# Patient Record
Sex: Male | Born: 1980 | Race: White | Hispanic: No | Marital: Married | Smoking: Never smoker
Health system: Southern US, Community
[De-identification: ages and names within clinical notes are randomized; demographics above are authoritative.]

## PROBLEM LIST (undated history)

## (undated) DIAGNOSIS — K5792 Diverticulitis of intestine, part unspecified, without perforation or abscess without bleeding: Secondary | ICD-10-CM

## (undated) DIAGNOSIS — M109 Gout, unspecified: Secondary | ICD-10-CM

## (undated) DIAGNOSIS — E7800 Pure hypercholesterolemia, unspecified: Secondary | ICD-10-CM

## (undated) HISTORY — PX: SMALL INTESTINE SURGERY: SHX150

---

## 2018-08-09 ENCOUNTER — Observation Stay
Admission: EM | Admit: 2018-08-09 | Discharge: 2018-08-10 | Disposition: A | Discharge status: Home / Self Care | Payer: 59 | Attending: General Surgery | Admitting: General Surgery

## 2018-08-09 ENCOUNTER — Encounter
Admission: EM | Disposition: A | Discharge status: Home / Self Care | Payer: Self-pay | Source: Home / Self Care | Attending: Emergency Medicine

## 2018-08-09 ENCOUNTER — Observation Stay: Payer: 59 | Admitting: Anesthesiology

## 2018-08-09 ENCOUNTER — Other Ambulatory Visit: Payer: Self-pay

## 2018-08-09 ENCOUNTER — Encounter: Payer: Self-pay | Admitting: Emergency Medicine

## 2018-08-09 ENCOUNTER — Emergency Department: Payer: 59

## 2018-08-09 DIAGNOSIS — K353 Acute appendicitis with localized peritonitis, without perforation or gangrene: Secondary | ICD-10-CM | POA: Diagnosis present

## 2018-08-09 DIAGNOSIS — K3533 Acute appendicitis with perforation and localized peritonitis, with abscess: Principal | ICD-10-CM | POA: Insufficient documentation

## 2018-08-09 DIAGNOSIS — K5730 Diverticulosis of large intestine without perforation or abscess without bleeding: Secondary | ICD-10-CM | POA: Insufficient documentation

## 2018-08-09 DIAGNOSIS — Z23 Encounter for immunization: Secondary | ICD-10-CM | POA: Insufficient documentation

## 2018-08-09 DIAGNOSIS — K3580 Unspecified acute appendicitis: Secondary | ICD-10-CM | POA: Diagnosis present

## 2018-08-09 HISTORY — PX: LAPAROSCOPIC APPENDECTOMY: SHX408

## 2018-08-09 LAB — TYPE AND SCREEN
ABO/RH(D): O POS
Antibody Screen: NEGATIVE

## 2018-08-09 LAB — URINALYSIS, COMPLETE (UACMP) WITH MICROSCOPIC
BILIRUBIN URINE: NEGATIVE
Bacteria, UA: NONE SEEN
Glucose, UA: NEGATIVE mg/dL
HGB URINE DIPSTICK: NEGATIVE
KETONES UR: NEGATIVE mg/dL
Leukocytes, UA: NEGATIVE
Nitrite: NEGATIVE
PH: 6 (ref 5.0–8.0)
Protein, ur: NEGATIVE mg/dL
SQUAMOUS EPITHELIAL / LPF: NONE SEEN (ref 0–5)
Specific Gravity, Urine: 1.042 — ABNORMAL HIGH (ref 1.005–1.030)

## 2018-08-09 LAB — CBC
HCT: 43.3 % (ref 39.0–52.0)
Hemoglobin: 15.6 g/dL (ref 13.0–17.0)
MCH: 31.1 pg (ref 26.0–34.0)
MCHC: 36 g/dL (ref 30.0–36.0)
MCV: 86.3 fL (ref 80.0–100.0)
Platelets: 288 K/uL (ref 150–400)
RBC: 5.02 MIL/uL (ref 4.22–5.81)
RDW: 12.1 % (ref 11.5–15.5)
WBC: 10.6 K/uL — ABNORMAL HIGH (ref 4.0–10.5)
nRBC: 0 % (ref 0.0–0.2)

## 2018-08-09 LAB — COMPREHENSIVE METABOLIC PANEL WITH GFR
ALT: 53 U/L — ABNORMAL HIGH (ref 0–44)
AST: 27 U/L (ref 15–41)
Albumin: 4.6 g/dL (ref 3.5–5.0)
Alkaline Phosphatase: 85 U/L (ref 38–126)
Anion gap: 9 (ref 5–15)
BUN: 12 mg/dL (ref 6–20)
CO2: 26 mmol/L (ref 22–32)
Calcium: 9.3 mg/dL (ref 8.9–10.3)
Chloride: 105 mmol/L (ref 98–111)
Creatinine, Ser: 1.01 mg/dL (ref 0.61–1.24)
GFR calc Af Amer: >60 mL/min
GFR calc non Af Amer: >60 mL/min
Glucose, Bld: 131 mg/dL — ABNORMAL HIGH (ref 70–99)
Potassium: 3.6 mmol/L (ref 3.5–5.1)
Sodium: 140 mmol/L (ref 135–145)
Total Bilirubin: 0.8 mg/dL (ref 0.3–1.2)
Total Protein: 7.6 g/dL (ref 6.5–8.1)

## 2018-08-09 LAB — LIPASE, BLOOD: Lipase: 97 U/L — ABNORMAL HIGH (ref 11–51)

## 2018-08-09 LAB — SURGICAL PCR SCREEN
MRSA, PCR: NEGATIVE
Staphylococcus aureus: POSITIVE — AB

## 2018-08-09 IMAGING — CR DG ABDOMEN ACUTE W/ 1V CHEST
4 series · 4 of 4 positions shown · non-contrast
Comparison: None.

CLINICAL DATA: Abdominal pain

EXAM:
DG ABDOMEN ACUTE W/ 1V CHEST

[chest pa]
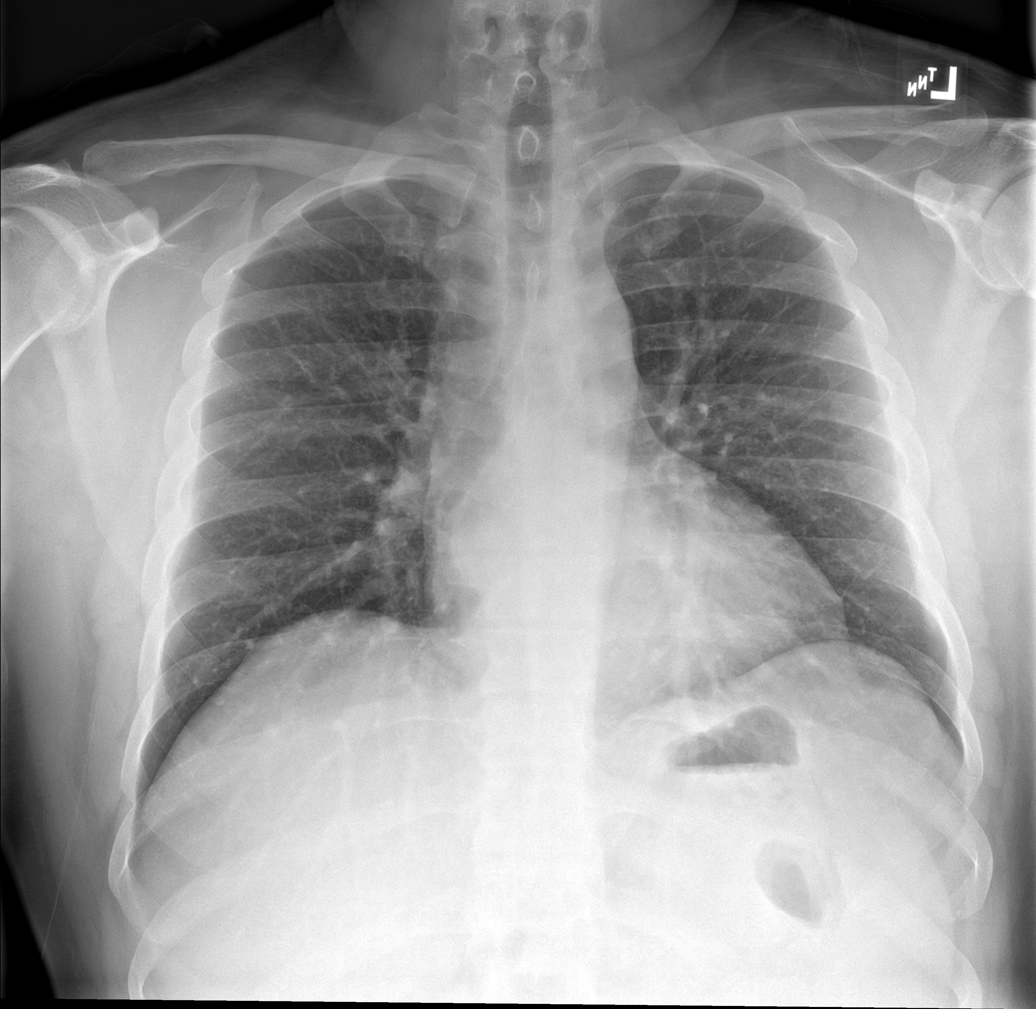

[abdomen erect]
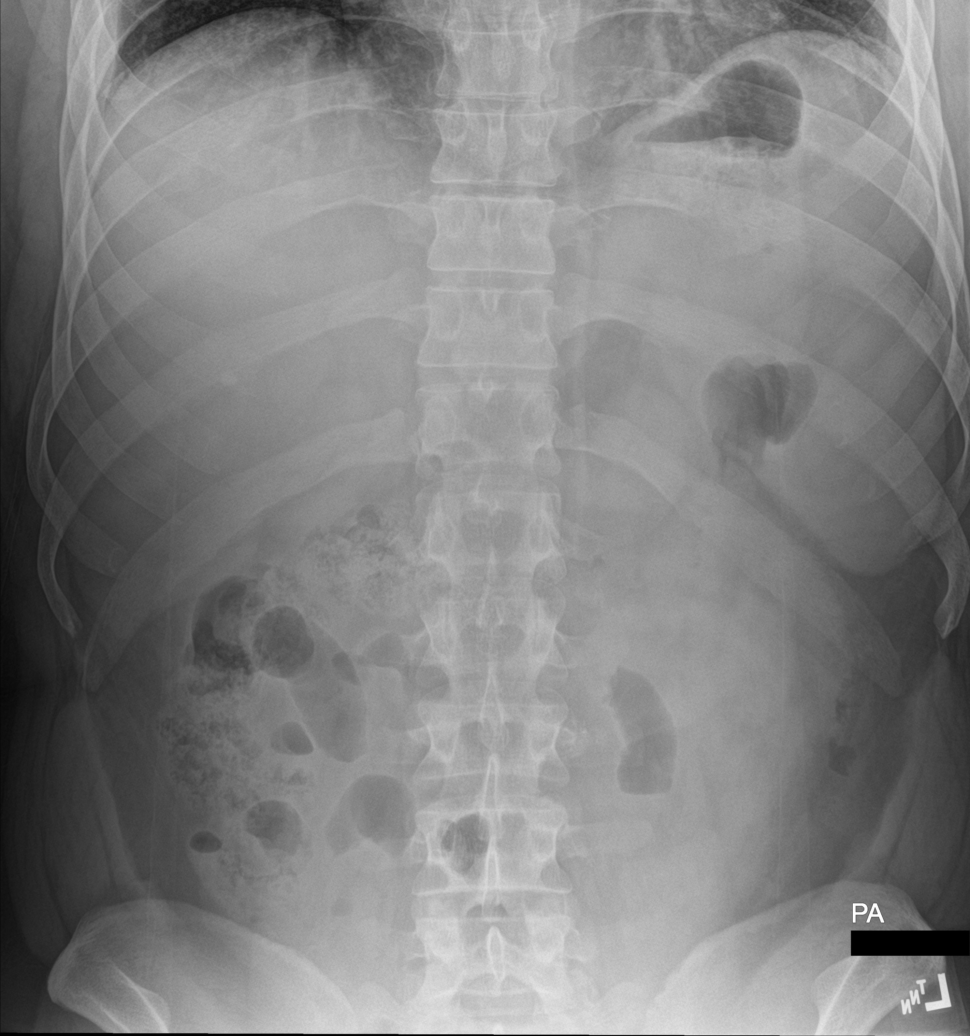

[abdomen supine (1 of 2)]
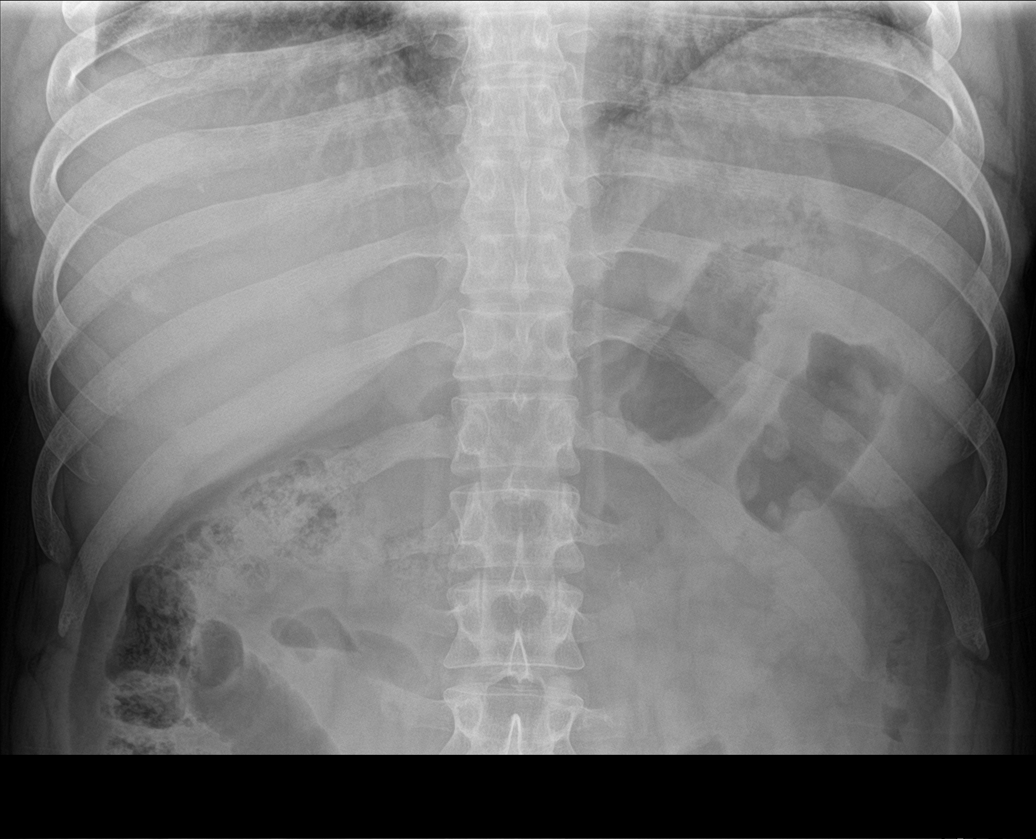

[abdomen supine (2 of 2)]
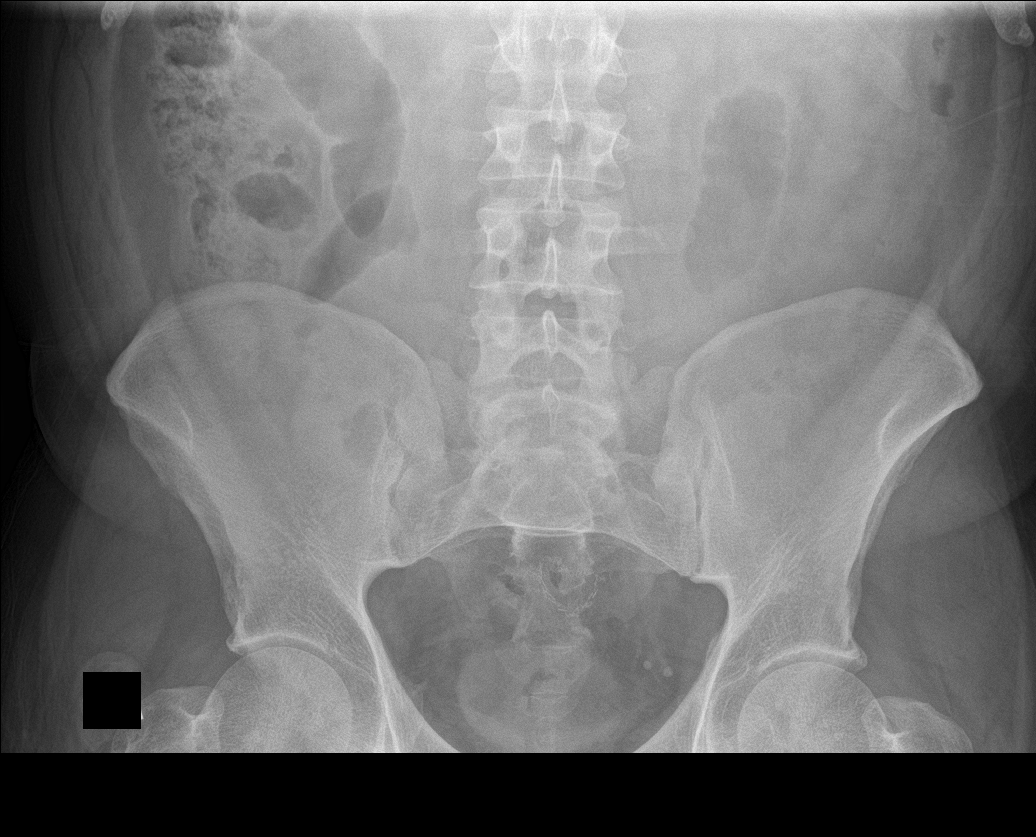

[4 of 4 positions shown; findings below may reference images not displayed]

FINDINGS: PA chest: Lungs are clear. Heart size and pulmonary vascularity are
normal. No adenopathy.

Supine and upright abdomen: There is moderate stool in the colon.
There is no appreciable bowel dilatation or air-fluid level to
suggest bowel obstruction. No free air. There are phleboliths in the
pelvis.
IMPRESSION: No evident bowel obstruction or free air. Moderate stool in colon.
Lungs clear.

## 2018-08-09 IMAGING — CT CT ABD-PELV W/ CM
2 of 4 series · 16 of 46 positions shown, 18 images · IV contrast (APPLIED)
Comparison: None.

CLINICAL DATA: Left-sided abdominal pain since this morning.
History of diverticulitis and partial colon resection.

EXAM:
CT ABDOMEN AND PELVIS WITH CONTRAST
TECHNIQUE: Multidetector CT imaging of the abdomen and pelvis was performed
using the standard protocol following bolus administration of
intravenous contrast.
CONTRAST:  100mL OMNIPAQUE IOHEXOL 300 MG/ML  SOLN

[Series 2: routine abd/pel with · axial · 0.85mm/px · z∈[-608,-128]mm · 13 of 106 slices shown, 15 images]
[im 5/106  soft-tissue]
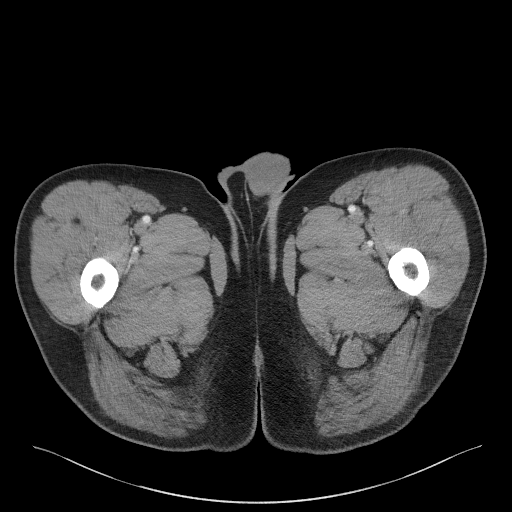
[im 5/106  bone]
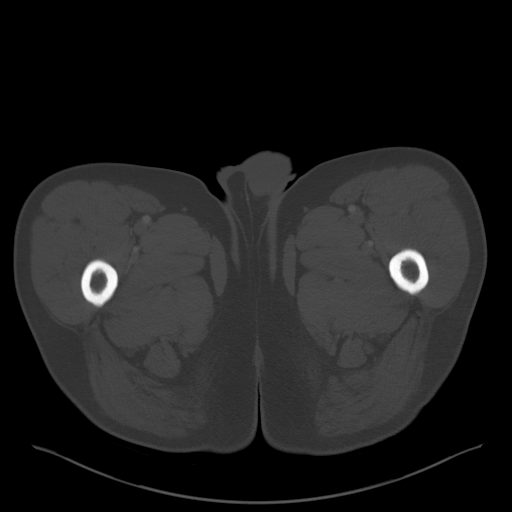
[im 14/106  soft-tissue]
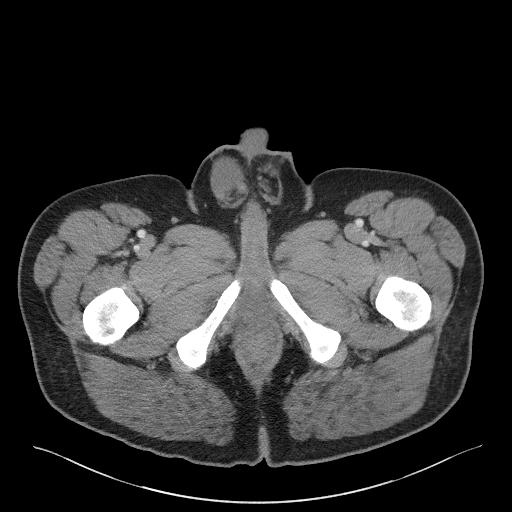
[im 23/106  soft-tissue]
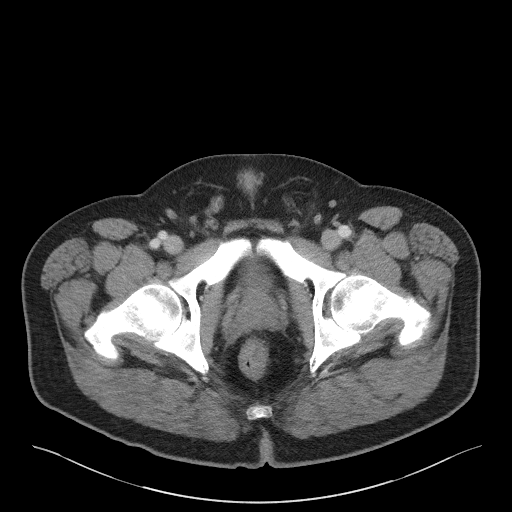
[im 28/106  soft-tissue]
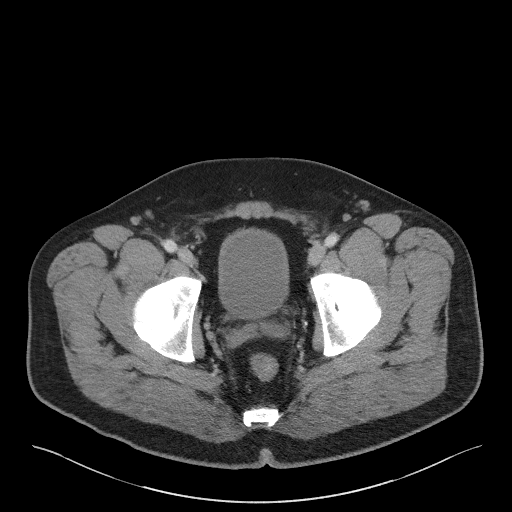
[im 37/106  soft-tissue]
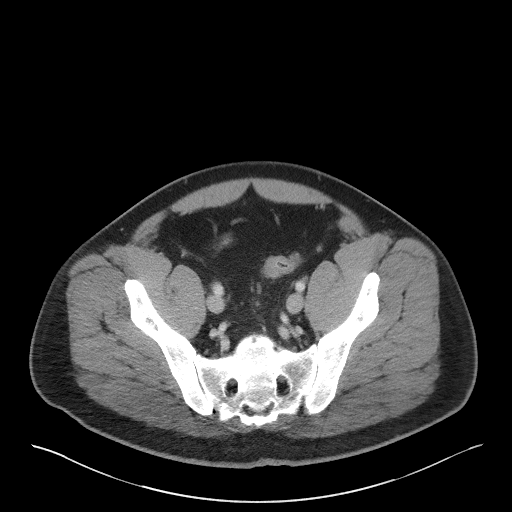
[im 46/106  soft-tissue]
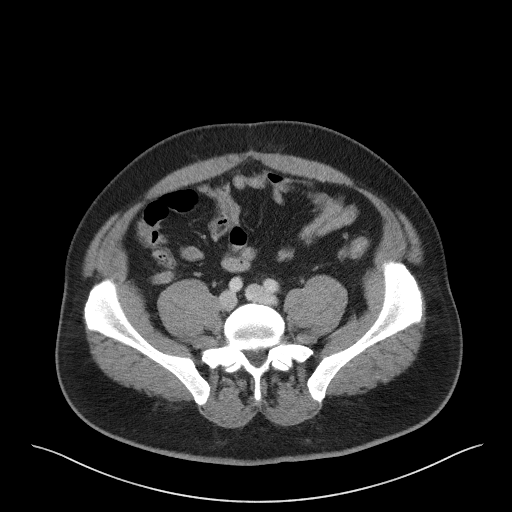
[im 55/106  soft-tissue]
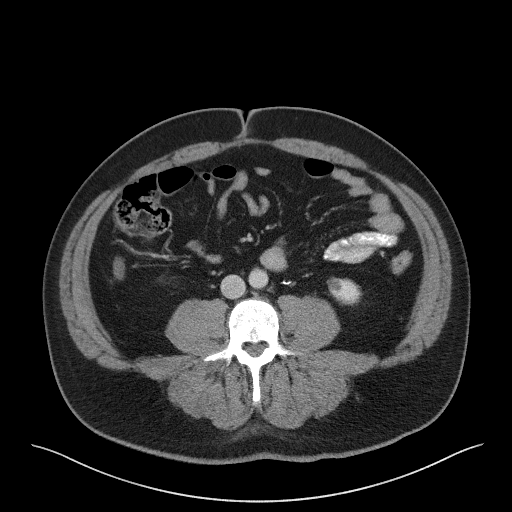
[im 60/106  soft-tissue]
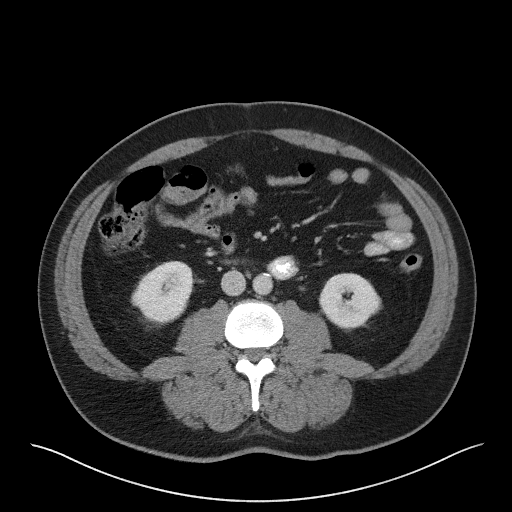
[im 69/106  soft-tissue]
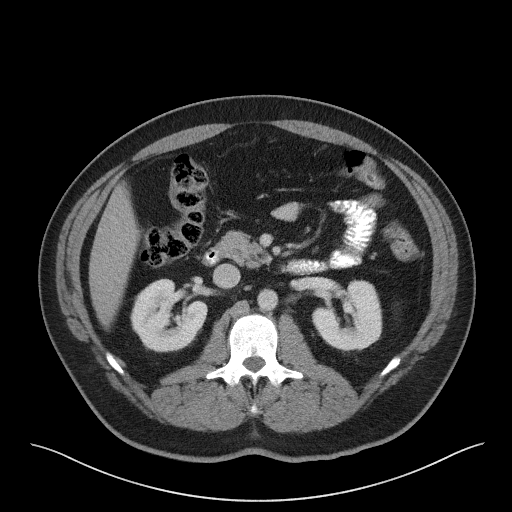
[im 69/106  bone]
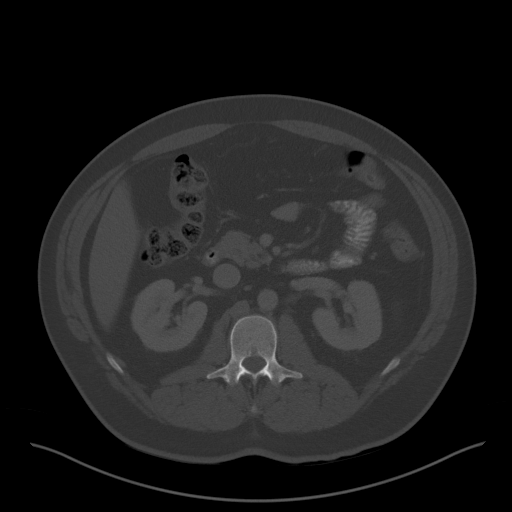
[im 78/106  soft-tissue]
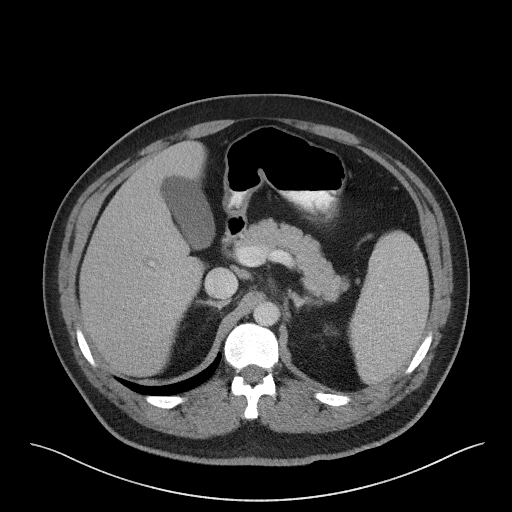
[im 83/106  soft-tissue]
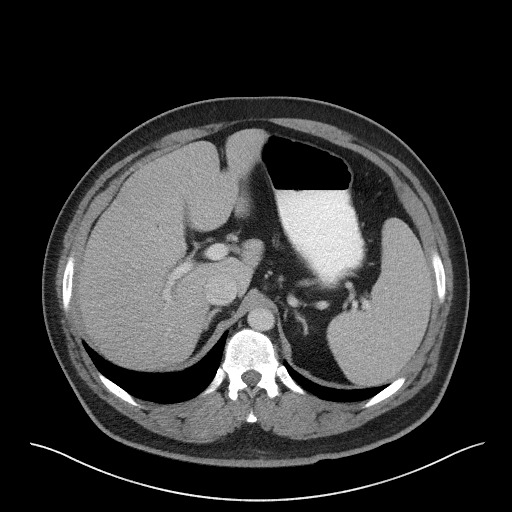
[im 92/106  soft-tissue]
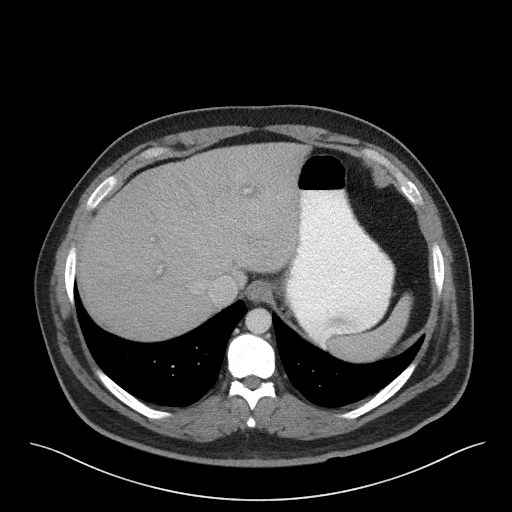
[im 101/106  soft-tissue]
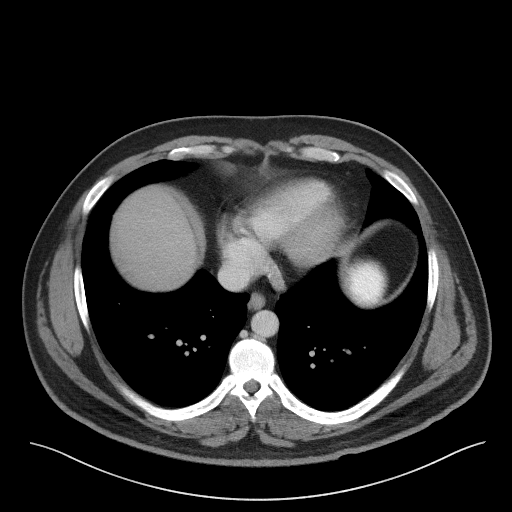

[Series 5: coronal st · coronal · 0.78mm/px · 3 of 108 slices shown]
[im 36/108  soft-tissue]
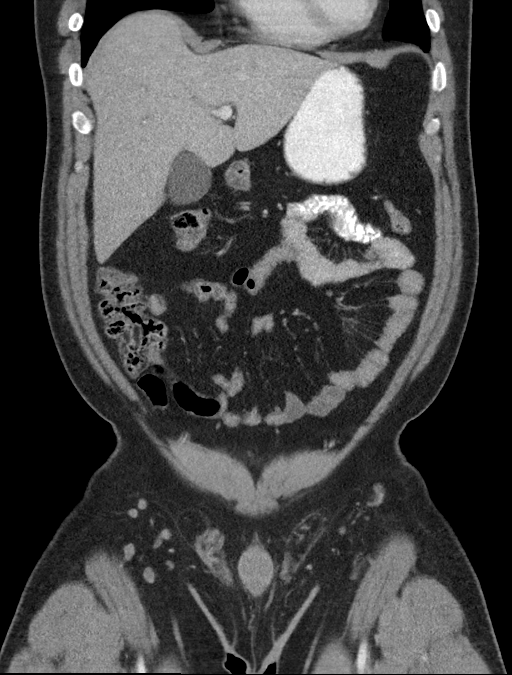
[im 48/108  soft-tissue]
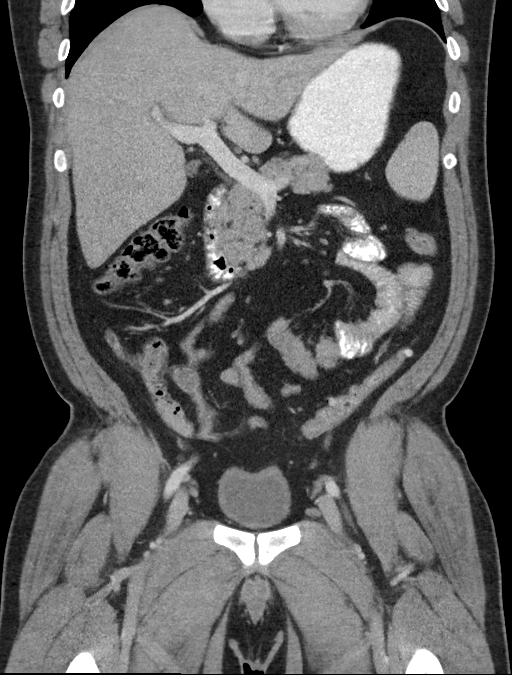
[im 60/108  soft-tissue]
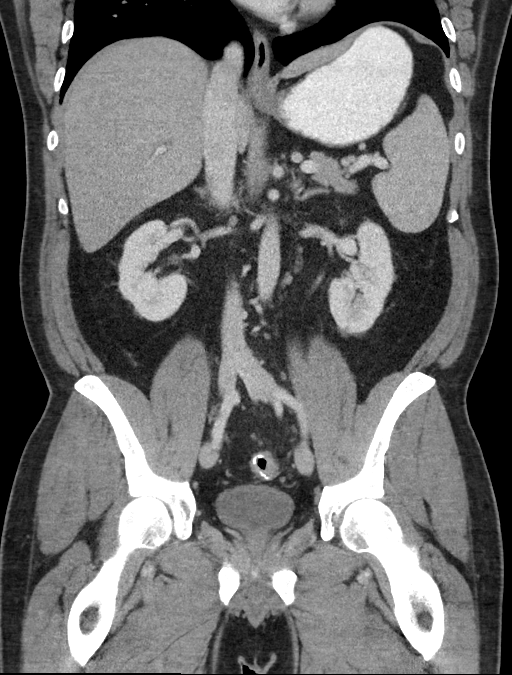

[16 of 46 positions shown; findings below may reference images not displayed]

FINDINGS: Lower chest: No acute abnormality.

Hepatobiliary: No focal liver abnormality is seen. Mild periportal
edema. No gallstones, gallbladder wall thickening, or biliary
dilatation.

Pancreas: Unremarkable. No pancreatic ductal dilatation or
surrounding inflammatory changes.

Spleen: Normal in size without focal abnormality.

Adrenals/Urinary Tract: Adrenal glands are unremarkable. Kidneys are
normal, without renal calculi, focal lesion, or hydronephrosis.
cm simple cyst arising from the lower pole of the left kidney.
Bladder is unremarkable.

Stomach/Bowel: Dilated appendix with mild periappendiceal fat
stranding, consistent with acute appendicitis.

Appendix: Location: Retrocecal

Diameter: 12 mm

Appendicolith: Present in the proximal appendix.

Mucosal hyper-enhancement: Present

Extraluminal gas: None.

Periappendiceal collection: None.

The stomach and small bowel are unremarkable. No obstruction. Prior
partial sigmoid colon resection. Mild sigmoid diverticulosis.

Vascular/Lymphatic: No significant vascular findings are present. No
enlarged abdominal or pelvic lymph nodes.

Reproductive: Prostate is unremarkable.

Other: No abdominal wall hernia or abnormality. No abdominopelvic
ascites. No pneumoperitoneum.

Musculoskeletal: No acute or significant osseous findings.
IMPRESSION: 1. Acute appendicitis.
2. Prior partial sigmoid colon resection. Mild sigmoid
diverticulosis without evidence of acute diverticulitis.

## 2018-08-09 SURGERY — APPENDECTOMY, LAPAROSCOPIC
Anesthesia: General

## 2018-08-09 MED ORDER — BUPIVACAINE-EPINEPHRINE 0.5% -1:200000 IJ SOLN
INTRAMUSCULAR | Status: DC | PRN
  Administered 2018-08-09: 10 mL

## 2018-08-09 MED ORDER — ACETAMINOPHEN 325 MG PO TABS: 650.0000 mg | ORAL_TABLET | Freq: Four times a day (QID) | ORAL | Status: DC | PRN

## 2018-08-09 MED ORDER — SUGAMMADEX SODIUM 200 MG/2ML IV SOLN
INTRAVENOUS | Status: DC | PRN
  Administered 2018-08-09: 200 mg via INTRAVENOUS

## 2018-08-09 MED ORDER — ENOXAPARIN SODIUM 40 MG/0.4ML ~~LOC~~ SOLN: 40.0000 mg | SUBCUTANEOUS | Status: DC

## 2018-08-09 MED ORDER — MORPHINE SULFATE (PF) 4 MG/ML IV SOLN
4.0000 mg | Freq: Once | INTRAVENOUS | Status: AC
  Administered 2018-08-09: 4 mg via INTRAVENOUS
  Filled 2018-08-09: qty 1

## 2018-08-09 MED ORDER — LIDOCAINE HCL (CARDIAC) PF 100 MG/5ML IV SOSY
PREFILLED_SYRINGE | INTRAVENOUS | Status: DC | PRN
  Administered 2018-08-09: 100 mg via INTRAVENOUS

## 2018-08-09 MED ORDER — CHLORHEXIDINE GLUCONATE CLOTH 2 % EX PADS: 6.0000 | MEDICATED_PAD | Freq: Every day | CUTANEOUS | Status: DC

## 2018-08-09 MED ORDER — IOHEXOL 300 MG/ML  SOLN
30.0000 mL | Freq: Once | INTRAMUSCULAR | Status: AC | PRN
  Administered 2018-08-09: 30 mL via ORAL

## 2018-08-09 MED ORDER — MIDAZOLAM HCL 2 MG/2ML IJ SOLN
INTRAMUSCULAR | Status: AC
  Filled 2018-08-09: qty 2

## 2018-08-09 MED ORDER — ACETAMINOPHEN 10 MG/ML IV SOLN
INTRAVENOUS | Status: AC
  Filled 2018-08-09: qty 100

## 2018-08-09 MED ORDER — SUCCINYLCHOLINE CHLORIDE 20 MG/ML IJ SOLN
INTRAMUSCULAR | Status: DC | PRN
  Administered 2018-08-09: 100 mg via INTRAVENOUS

## 2018-08-09 MED ORDER — MUPIROCIN 2 % EX OINT
1.0000 "application " | TOPICAL_OINTMENT | Freq: Two times a day (BID) | CUTANEOUS | Status: DC
  Administered 2018-08-10: 1 via NASAL
  Filled 2018-08-09: qty 22

## 2018-08-09 MED ORDER — IOHEXOL 300 MG/ML  SOLN
100.0000 mL | Freq: Once | INTRAMUSCULAR | Status: AC | PRN
  Administered 2018-08-09: 100 mL via INTRAVENOUS

## 2018-08-09 MED ORDER — ROCURONIUM BROMIDE 100 MG/10ML IV SOLN
INTRAVENOUS | Status: DC | PRN
  Administered 2018-08-09 (×2): 30 mg via INTRAVENOUS
  Administered 2018-08-09: 10 mg via INTRAVENOUS

## 2018-08-09 MED ORDER — CIPROFLOXACIN IN D5W 400 MG/200ML IV SOLN
400.0000 mg | Freq: Two times a day (BID) | INTRAVENOUS | Status: DC
  Administered 2018-08-09 – 2018-08-10 (×2): 400 mg via INTRAVENOUS
  Filled 2018-08-09 (×4): qty 200

## 2018-08-09 MED ORDER — INFLUENZA VAC SPLIT QUAD 0.5 ML IM SUSY
0.5000 mL | PREFILLED_SYRINGE | INTRAMUSCULAR | Status: AC
  Administered 2018-08-10: 0.5 mL via INTRAMUSCULAR
  Filled 2018-08-09: qty 0.5

## 2018-08-09 MED ORDER — FENTANYL CITRATE (PF) 100 MCG/2ML IJ SOLN
INTRAMUSCULAR | Status: DC | PRN
  Administered 2018-08-09 (×2): 50 ug via INTRAVENOUS

## 2018-08-09 MED ORDER — MUPIROCIN 2 % EX OINT
1.0000 "application " | TOPICAL_OINTMENT | Freq: Two times a day (BID) | CUTANEOUS | Status: DC
  Filled 2018-08-09: qty 22

## 2018-08-09 MED ORDER — ONDANSETRON HCL 4 MG/2ML IJ SOLN
INTRAMUSCULAR | Status: DC | PRN
  Administered 2018-08-09: 4 mg via INTRAVENOUS

## 2018-08-09 MED ORDER — DEXAMETHASONE SODIUM PHOSPHATE 10 MG/ML IJ SOLN
INTRAMUSCULAR | Status: DC | PRN
  Administered 2018-08-09: 10 mg via INTRAVENOUS

## 2018-08-09 MED ORDER — FAMOTIDINE IN NACL 20-0.9 MG/50ML-% IV SOLN
20.0000 mg | Freq: Two times a day (BID) | INTRAVENOUS | Status: DC
  Administered 2018-08-09 – 2018-08-10 (×2): 20 mg via INTRAVENOUS
  Filled 2018-08-09 (×2): qty 50

## 2018-08-09 MED ORDER — METRONIDAZOLE IN NACL 5-0.79 MG/ML-% IV SOLN
500.0000 mg | Freq: Three times a day (TID) | INTRAVENOUS | Status: DC
  Administered 2018-08-09 – 2018-08-10 (×2): 500 mg via INTRAVENOUS
  Filled 2018-08-09 (×5): qty 100

## 2018-08-09 MED ORDER — HYDROCODONE-ACETAMINOPHEN 5-325 MG PO TABS: 1.0000 | ORAL_TABLET | ORAL | Status: DC | PRN

## 2018-08-09 MED ORDER — ACETAMINOPHEN 650 MG RE SUPP: 650.0000 mg | Freq: Four times a day (QID) | RECTAL | Status: DC | PRN

## 2018-08-09 MED ORDER — PROMETHAZINE HCL 25 MG/ML IJ SOLN: 6.2500 mg | INTRAMUSCULAR | Status: DC | PRN

## 2018-08-09 MED ORDER — PROPOFOL 10 MG/ML IV BOLUS
INTRAVENOUS | Status: AC
  Filled 2018-08-09: qty 20

## 2018-08-09 MED ORDER — PROPOFOL 10 MG/ML IV BOLUS
INTRAVENOUS | Status: DC | PRN
  Administered 2018-08-09: 120 mg via INTRAVENOUS
  Administered 2018-08-09: 200 mg via INTRAVENOUS

## 2018-08-09 MED ORDER — EVICEL 2 ML EX KIT
PACK | CUTANEOUS | Status: AC
  Filled 2018-08-09: qty 1

## 2018-08-09 MED ORDER — SODIUM CHLORIDE 0.9 % IV SOLN
1000.0000 mL | Freq: Once | INTRAVENOUS | Status: AC
  Administered 2018-08-09: 1000 mL via INTRAVENOUS

## 2018-08-09 MED ORDER — FENTANYL CITRATE (PF) 100 MCG/2ML IJ SOLN: 25.0000 ug | INTRAMUSCULAR | Status: DC | PRN

## 2018-08-09 MED ORDER — SODIUM CHLORIDE 0.9 % IV SOLN
INTRAVENOUS | Status: DC
  Administered 2018-08-09: 21:00:00 via INTRAVENOUS

## 2018-08-09 MED ORDER — FENTANYL CITRATE (PF) 100 MCG/2ML IJ SOLN
INTRAMUSCULAR | Status: AC
  Filled 2018-08-09: qty 2

## 2018-08-09 MED ORDER — SODIUM CHLORIDE 0.9 % IV SOLN
INTRAVENOUS | Status: DC | PRN
  Administered 2018-08-09: 18:00:00 via INTRAVENOUS

## 2018-08-09 MED ORDER — ONDANSETRON HCL 4 MG/2ML IJ SOLN
4.0000 mg | Freq: Once | INTRAMUSCULAR | Status: AC
  Administered 2018-08-09: 4 mg via INTRAVENOUS
  Filled 2018-08-09: qty 2

## 2018-08-09 MED ORDER — MIDAZOLAM HCL 2 MG/2ML IJ SOLN
INTRAMUSCULAR | Status: DC | PRN
  Administered 2018-08-09: 2 mg via INTRAVENOUS

## 2018-08-09 MED ORDER — MORPHINE SULFATE (PF) 4 MG/ML IV SOLN: 4.0000 mg | INTRAVENOUS | Status: DC | PRN

## 2018-08-09 MED ORDER — ACETAMINOPHEN 10 MG/ML IV SOLN
INTRAVENOUS | Status: DC | PRN
  Administered 2018-08-09: 1000 mg via INTRAVENOUS

## 2018-08-09 SURGICAL SUPPLY — 40 items
APPLIER CLIP LOGIC TI 5 (MISCELLANEOUS) IMPLANT
BLADE SURG SZ11 CARB STEEL (BLADE) ×3 IMPLANT
CANISTER SUCT 1200ML W/VALVE (MISCELLANEOUS) ×3 IMPLANT
CHLORAPREP W/TINT 26ML (MISCELLANEOUS) ×3 IMPLANT
COVER WAND RF STERILE (DRAPES) IMPLANT
CUTTER FLEX LINEAR 45M (STAPLE) ×3 IMPLANT
DERMABOND ADVANCED (GAUZE/BANDAGES/DRESSINGS) ×2
DERMABOND ADVANCED .7 DNX12 (GAUZE/BANDAGES/DRESSINGS) ×1 IMPLANT
ELECT REM PT RETURN 9FT ADLT (ELECTROSURGICAL) ×3
ELECTRODE REM PT RTRN 9FT ADLT (ELECTROSURGICAL) ×1 IMPLANT
EVICEL AIRLESS SPRAY ACCES (MISCELLANEOUS) ×3 IMPLANT
GLOVE BIO SURGEON STRL SZ 6.5 (GLOVE) ×10 IMPLANT
GLOVE BIO SURGEONS STRL SZ 6.5 (GLOVE) ×5
GOWN STRL REUS W/ TWL LRG LVL3 (GOWN DISPOSABLE) ×4 IMPLANT
GOWN STRL REUS W/TWL LRG LVL3 (GOWN DISPOSABLE) ×8
GRASPER SUT TROCAR 14GX15 (MISCELLANEOUS) IMPLANT
HANDLE YANKAUER SUCT BULB TIP (MISCELLANEOUS) IMPLANT
IRRIGATION STRYKERFLOW (MISCELLANEOUS) ×1 IMPLANT
IRRIGATOR STRYKERFLOW (MISCELLANEOUS) ×3
IV NS 1000ML (IV SOLUTION) ×2
IV NS 1000ML BAXH (IV SOLUTION) ×1 IMPLANT
KIT TURNOVER KIT A (KITS) ×3 IMPLANT
LIGASURE LAP MARYLAND 5MM 37CM (ELECTROSURGICAL) IMPLANT
NEEDLE HYPO 22GX1.5 SAFETY (NEEDLE) ×3 IMPLANT
NEEDLE VERESS 14GA 120MM (NEEDLE) IMPLANT
NS IRRIG 500ML POUR BTL (IV SOLUTION) ×3 IMPLANT
PACK LAP CHOLECYSTECTOMY (MISCELLANEOUS) ×3 IMPLANT
POUCH ENDO CATCH 10MM SPEC (MISCELLANEOUS) ×3 IMPLANT
RELOAD 45 VASCULAR/THIN (ENDOMECHANICALS) IMPLANT
RELOAD STAPLE TA45 3.5 REG BLU (ENDOMECHANICALS) ×3 IMPLANT
SCISSORS METZENBAUM CVD 33 (INSTRUMENTS) IMPLANT
SPONGE GAUZE 2X2 8PLY STER LF (GAUZE/BANDAGES/DRESSINGS)
SPONGE GAUZE 2X2 8PLY STRL LF (GAUZE/BANDAGES/DRESSINGS) IMPLANT
SUT MNCRL AB 4-0 PS2 18 (SUTURE) ×3 IMPLANT
SUT VICRYL PLUS ABS 0 54 (SUTURE) ×3 IMPLANT
TIP RIGID 35CM EVICEL (HEMOSTASIS) ×3 IMPLANT
TRAY FOLEY MTR SLVR 16FR STAT (SET/KITS/TRAYS/PACK) ×3 IMPLANT
TROCAR XCEL 12X100 BLDLESS (ENDOMECHANICALS) ×3 IMPLANT
TROCAR XCEL NON-BLD 5MMX100MML (ENDOMECHANICALS) ×6 IMPLANT
TUBING INSUFFLATION (TUBING) ×3 IMPLANT

## 2018-08-09 NOTE — Anesthesia Postprocedure Evaluation (Signed)
Anesthesia Post Note  Patient: Alec Arnold  Procedure(s) Performed: APPENDECTOMY LAPAROSCOPIC (N/A )  Patient location during evaluation: PACU Anesthesia Type: General Level of consciousness: awake and alert Pain management: pain level controlled Vital Signs Assessment: post-procedure vital signs reviewed and stable Respiratory status: spontaneous breathing, nonlabored ventilation, respiratory function stable and patient connected to nasal cannula oxygen Cardiovascular status: blood pressure returned to baseline and stable Postop Assessment: no apparent nausea or vomiting Anesthetic complications: no     Last Vitals:  Vitals:   08/09/18 2050 08/09/18 2055  BP:    Pulse: 95 90  Resp: 10 11  Temp:    SpO2: 94% 95%    Last Pain:  Vitals:   08/09/18 2045  TempSrc:   PainSc: 0-No pain                 Lenard Simmer

## 2018-08-09 NOTE — Anesthesia Post-op Follow-up Note (Signed)
Anesthesia QCDR form completed.        

## 2018-08-09 NOTE — ED Triage Notes (Signed)
Patient to ER for c/o left sided abd pain since approx 0400 this am. Patient pale in triage. Denies any known fevers.

## 2018-08-09 NOTE — ED Provider Notes (Signed)
[facility] Emergency Department Provider Note   ____________________________________________    I have reviewed the triage vital signs and the nursing notes.   HISTORY  Chief Complaint Abdominal Pain     HPI Alec Arnold is a 37 y.o. male who presents with complaints of abdominal pain.  Patient reports in 2013 he had perforated diverticuli is requiring surgery.  Patient reports he woke up at 4 AM this morning with left lower quadrant abdominal pain.  No flank pain.  No dysuria.  No nausea or vomiting.  Reports normal stools.  Not on blood thinners.  Reports the pain is moderate to severe and sharp in nature.  Does not radiate.  Feels somewhat similar to prior diverticulitis   History reviewed. No pertinent past medical history.  There are no active problems to display for this patient.   Past Surgical History:  Procedure Laterality Date  . SMALL INTESTINE SURGERY      Prior to Admission medications   Not on File     Allergies Penicillins  No family history on file.  Social History Social History   Tobacco Use  . Smoking status: Never Smoker  . Smokeless tobacco: Current User  Substance Use Topics  . Alcohol use: Yes    Comment: occasional  . Drug use: Not on file    Review of Systems  Constitutional: Pale per wife Eyes: No visual changes.  ENT: No sore throat. Cardiovascular: Denies chest pain. Respiratory: Denies shortness of breath. Gastrointestinal: As above Genitourinary: Negative for dysuria. Musculoskeletal: Negative for back pain. Skin: Negative for rash. Neurological: Negative for headaches    ____________________________________________   PHYSICAL EXAM:  VITAL SIGNS: ED Triage Vitals  Enc Vitals Group     BP 08/09/18 0906 128/78     Pulse Rate 08/09/18 0906 65     Resp 08/09/18 0906 18     Temp 08/09/18 0906 97.8 F (36.6 C)     Temp Source 08/09/18 0906 Oral     SpO2 08/09/18 0906 96 %   Weight 08/09/18 0906 99.8 kg (220 lb)     Height 08/09/18 0906 1.727 m (5\' 8" )     Head Circumference --      Peak Flow --      Pain Score 08/09/18 0909 9     Pain Loc --      Pain Edu? --      Excl. in GC? --     Constitutional: Alert and oriented.  Pallor Eyes: Conjunctivae are normal.   Nose: No congestion/rhinnorhea. Mouth/Throat: Mucous membranes are moist.    Cardiovascular: Normal rate, regular rhythm. Grossly normal heart sounds.  Good peripheral circulation. Respiratory: Normal respiratory effort.  No retractions.  Gastrointestinal: Mild to moderate tenderness left lower quadrant, no distention, no CVA tenderness on either side  Musculoskeletal:  Warm and well perfused Neurologic:  Normal speech and language. No gross focal neurologic deficits are appreciated.  Skin:  Skin is warm, dry and intact. No rash noted. Psychiatric: Mood and affect are normal. Speech and behavior are normal.  ____________________________________________   LABS (all labs ordered are listed, but only abnormal results are displayed)  Labs Reviewed  LIPASE, BLOOD - Abnormal; Notable for the following components:      Result Value   Lipase 97 (*)    All other components within normal limits  COMPREHENSIVE METABOLIC PANEL - Abnormal; Notable for the following components:   Glucose, Bld 131 (*)    ALT 53 (*)  All other components within normal limits  CBC - Abnormal; Notable for the following components:   WBC 10.6 (*)    All other components within normal limits  URINALYSIS, COMPLETE (UACMP) WITH MICROSCOPIC  TYPE AND SCREEN   ____________________________________________  EKG  ED ECG REPORT I, Jene Every, the attending physician, personally viewed and interpreted this ECG.  Date: 08/09/2018  Rhythm: normal sinus rhythm QRS Axis: normal Intervals: normal ST/T Wave abnormalities: normal Narrative Interpretation: no evidence of acute  ischemia  ____________________________________________  RADIOLOGY  Acute abdomen x-ray CT abdomen pelvis demonstrates acute appendicitis ____________________________________________   PROCEDURES  Procedure(s) performed: No  Procedures   Critical Care performed:No ____________________________________________   INITIAL IMPRESSION / ASSESSMENT AND PLAN / ED COURSE  Pertinent labs & imaging results that were available during my care of the patient were reviewed by me and considered in my medical decision making (see chart for details).  Patient presents with left lower quadrant pain, he is pale and somewhat ill-appearing although vitals are stable.  Suspicious for diverticulitis, relatively acute onset suspicious for perforation although he reports he felt well yesterday.  Also on the differential is ureterolithiasis however location of pain does not appear consistent with kidney stone  Will obtain upright x-ray, while preparing for CT abdomen pelvis, will give IV morphine, IV Zofran.  Upright x-ray unremarkable, no free air.  CT scan demonstrates acute appendicitis, no obvious perforation.  Patient reports pain is under control after morphine.  Will discuss with surgery for admission    ____________________________________________   FINAL CLINICAL IMPRESSION(S) / ED DIAGNOSES  Final diagnoses:  Acute appendicitis, unspecified acute appendicitis type        Note:  This document was prepared using Dragon voice recognition software and may include unintentional dictation errors.    Jene Every, MD 08/09/18 1224

## 2018-08-09 NOTE — Transfer of Care (Signed)
Immediate Anesthesia Transfer of Care Note  Patient: Alec Arnold  Procedure(s) Performed: APPENDECTOMY LAPAROSCOPIC (N/A )  Patient Location: PACU  Anesthesia Type:General  Level of Consciousness: awake  Airway & Oxygen Therapy: Patient connected to face mask oxygen  Post-op Assessment: Post -op Vital signs reviewed and stable  Post vital signs: stable  Last Vitals:  Vitals Value Taken Time  BP 122/76 08/09/2018  8:05 PM  Temp    Pulse 100 08/09/2018  8:05 PM  Resp 16 08/09/2018  8:05 PM  SpO2 98 % 08/09/2018  8:05 PM    Last Pain:  Vitals:   08/09/18 1629  TempSrc:   PainSc: 7          Complications: No apparent anesthesia complications

## 2018-08-09 NOTE — Op Note (Signed)
Preoperative diagnosis: Acute appendicitis.  Postoperative diagnosis: Acute appendicitis  Procedure: Laparoscopic appendectomy.  Anesthesia: GETA  Surgeon: Dr. Hazle Quant, MD  Wound Classification: Contaminated  Indications: Patient is a 37 y.o. male  presented with right lower quadrant pain of 1 day duration, minimal elevated WBC. Computed tomography scan and physical examination were consistent with acute appendicitis.   Findings: 1. Acutely inflamed appendix 2. No peri-appendiceal abscess or phlegmon 3. Normal anatomy 4. Adequate hemostasis.   Description of procedure: The patient was placed on the operating table in the supine position. General anesthesia was induced. A time-out was completed verifying correct patient, procedure, site, positioning, and implant(s) and/or special equipment prior to beginning this procedure. A Foley catheter and orogastric tubes were placed. The abdomen was prepped and draped in the usual sterile fashion.  An incision was made in a natural skin line above the umbilicus.   The abdomen was entered using Hasson technique. The abdomen was insufflated with carbon dioxide to a pressure of 15 mmHg. The patient tolerated insufflation well. The laparoscope was inserted and the abdomen inspected. No injuries from initial trocar placement were noted. Turbid fluid was noted in the right lower quadrant. Under direct visualization, an 5-mm trocar was inserted in the left lower quadrant lateral to the rectus muscle. A 5-mm port was then placed above the symphysis pubis on midline.  Care was taken to avoid injury to the bladder or inferior epigastric vessels. The table was placed in the Trendelenburg position with the right side elevated.  The cecum was gently grasped with an endoscopic graspers and pulled toward (the left upper quadrant). An atraumatic grasper was then passed through the suprapubic port and omentum was dissected away until the appendix was identified.  The appendix was then grasped and elevated. It was noted to be inflamed.  An endoscopic linear cutting stapler was then used to divide and staple the base of the appendix. The mesoappendix was divided with Ligasure device. The cecum base staple line was irrigated with fibrin sealant for hemostasis.  The appendix was placed in an endoscopic retrieval bag and removed.  The appendiceal stump was then irrigated and hemostasis was assured. Fluid was suctioned and no other pathology was identified.  Secondary trocars were removed under direct vision. No bleeding was noted. The laparoscope was withdrawn and the umbilical trocar removed. The abdomen was allowed to collapse. All trocar sites greater than 5 mm were closed with Vicryl 0. The skin was closed with subcuticular sutures Monocryl 3-0 of and steristrips.  The patient tolerated the procedure well and was taken to the postanesthesia care unit in satisfactory condition.   Specimen: Appendix  Complications: None  Estimated Blood Loss: 10 mL

## 2018-08-09 NOTE — ED Notes (Signed)
CT notified that pt was ready for CT

## 2018-08-09 NOTE — Anesthesia Procedure Notes (Signed)
Procedure Name: Intubation Date/Time: 08/09/2018 6:40 PM Performed by: Aline Brochure, CRNA Pre-anesthesia Checklist: Patient identified, Patient being monitored, Timeout performed, Emergency Drugs available and Suction available Patient Re-evaluated:Patient Re-evaluated prior to induction Oxygen Delivery Method: Circle system utilized Preoxygenation: Pre-oxygenation with 100% oxygen Induction Type: IV induction Ventilation: Mask ventilation without difficulty Laryngoscope Size: Mac and 4 Grade View: Grade II Tube type: Oral Tube size: 7.5 mm Number of attempts: 3 Airway Equipment and Method: Stylet Placement Confirmation: ETT inserted through vocal cords under direct vision,  positive ETCO2 and breath sounds checked- equal and bilateral Secured at: 25 cm Tube secured with: Tape Dental Injury: Teeth and Oropharynx as per pre-operative assessment

## 2018-08-09 NOTE — Anesthesia Preprocedure Evaluation (Signed)
Anesthesia Evaluation  Patient identified by MRN, date of birth, ID band Patient awake    Reviewed: Allergy & Precautions, H&P , NPO status , Patient's Chart, lab work & pertinent test results, reviewed documented beta blocker date and time   History of Anesthesia Complications Negative for: history of anesthetic complications  Airway Mallampati: IV  TM Distance: >3 FB Neck ROM: full    Dental  (+) Caps, Dental Advidsory Given, Teeth Intact   Pulmonary neg pulmonary ROS,           Cardiovascular Exercise Tolerance: Good negative cardio ROS       Neuro/Psych negative neurological ROS  negative psych ROS   GI/Hepatic negative GI ROS, Neg liver ROS,   Endo/Other  negative endocrine ROS  Renal/GU negative Renal ROS  negative genitourinary   Musculoskeletal   Abdominal   Peds  Hematology negative hematology ROS (+)   Anesthesia Other Findings History reviewed. No pertinent past medical history.   Reproductive/Obstetrics negative OB ROS                             Anesthesia Physical Anesthesia Plan  ASA: II  Anesthesia Plan: General   Post-op Pain Management:    Induction: Intravenous, Rapid sequence and Cricoid pressure planned  PONV Risk Score and Plan: 2 and Ondansetron, Dexamethasone, Midazolam, Promethazine and Treatment may vary due to age or medical condition  Airway Management Planned: Oral ETT  Additional Equipment:   Intra-op Plan:   Post-operative Plan: Extubation in OR  Informed Consent: I have reviewed the patients History and Physical, chart, labs and discussed the procedure including the risks, benefits and alternatives for the proposed anesthesia with the patient or authorized representative who has indicated his/her understanding and acceptance.   Dental Advisory Given  Plan Discussed with: Anesthesiologist, CRNA and Surgeon  Anesthesia Plan Comments:          Anesthesia Quick Evaluation

## 2018-08-09 NOTE — H&P (Signed)
SURGICAL CONSULTATION NOTE   HISTORY OF PRESENT ILLNESS (HPI):  37 y.o. male presented to [facility] ED for evaluation of abdominal pain. Patient reports pain started today at 4pm. Pain on peri umbilical area. Pain associated with nausea and vomiting. Pain does not radiates. Pain aggravated with palpation and certain movements. Pain improved with pain medications. Denies fever or chills. Denies diarrhea. Patient has history of diverticulitis with history of sigmoid resection. At ED CT scan was done. Images personally reviewed. Theres in enlargement of the appendix with associated inflammation. No abscess or free air.   Surgery is consulted by Dr. Cyril Loosen in this context for evaluation and management of acute appendicitis.  PAST MEDICAL HISTORY (PMH):  History reviewed. No pertinent past medical history.   PAST SURGICAL HISTORY (PSH):  Past Surgical History:  Procedure Laterality Date  . SMALL INTESTINE SURGERY       MEDICATIONS:  Prior to Admission medications   Not on File     ALLERGIES:  Allergies  Allergen Reactions  . Penicillins Hives    Has patient had a PCN reaction causing immediate rash, facial/tongue/throat swelling, SOB or lightheadedness with hypotension: Yes Has patient had a PCN reaction causing severe rash involving mucus membranes or skin necrosis: No Has patient had a PCN reaction that required hospitalization: No Has patient had a PCN reaction occurring within the last 10 years: No If all of the above answers are "NO", then may proceed with Cephalosporin use.     SOCIAL HISTORY:  Social History   Socioeconomic History  . Marital status: Married    Spouse name: Not on file  . Number of children: Not on file  . Years of education: Not on file  . Highest education level: Not on file  Occupational History  . Not on file  Social Needs  . Financial resource strain: Not on file  . Food insecurity:    Worry: Not on file    Inability: Not on file  . Transportation  needs:    Medical: Not on file    Non-medical: Not on file  Tobacco Use  . Smoking status: Never Smoker  . Smokeless tobacco: Current User  Substance and Sexual Activity  . Alcohol use: Yes    Comment: occasional  . Drug use: Not on file  . Sexual activity: Not on file  Lifestyle  . Physical activity:    Days per week: Not on file    Minutes per session: Not on file  . Stress: Not on file  Relationships  . Social connections:    Talks on phone: Not on file    Gets together: Not on file    Attends religious service: Not on file    Active member of club or organization: Not on file    Attends meetings of clubs or organizations: Not on file    Relationship status: Not on file  . Intimate partner violence:    Fear of current or ex partner: Not on file    Emotionally abused: Not on file    Physically abused: Not on file    Forced sexual activity: Not on file  Other Topics Concern  . Not on file  Social History Narrative  . Not on file    The patient currently resides (home / rehab facility / nursing home): Home The patient normally is (ambulatory / bedbound): Ambulatory   FAMILY HISTORY:  No family history on file.   REVIEW OF SYSTEMS:  Constitutional: denies weight loss, fever, chills, or  sweats  Eyes: denies any other vision changes, history of eye injury  ENT: denies sore throat, hearing problems  Respiratory: denies shortness of breath, wheezing  Cardiovascular: denies chest pain, palpitations  Gastrointestinal: positive abdominal pain, Negative diarrhea Genitourinary: denies burning with urination or urinary frequency Musculoskeletal: denies any other joint pains or cramps  Skin: denies any other rashes or skin discolorations  Neurological: denies any other headache, dizziness, weakness  Psychiatric: denies any other depression, anxiety   All other review of systems were negative   VITAL SIGNS:  Temp:  [97.8 F (36.6 C)] 97.8 F (36.6 C) (10/31 0906) Pulse  Rate:  [65-76] 71 (10/31 1200) Resp:  [18] 18 (10/31 0906) BP: (121-133)/(78-90) 121/89 (10/31 1200) SpO2:  [94 %-100 %] 94 % (10/31 1200) Weight:  [99.8 kg] 99.8 kg (10/31 0906)     Height: 5\' 8"  (172.7 cm) Weight: 99.8 kg BMI (Calculated): 33.46   INTAKE/OUTPUT:  This shift: Total I/O In: 1000 [I.V.:1000] Out: -   Last 2 shifts: @IOLAST2SHIFTS @   PHYSICAL EXAM:  Constitutional:  -- Normal body habitus  -- Awake, alert, and oriented x3  Eyes:  -- Pupils equally round and reactive to light  -- No scleral icterus  Ear, nose, and throat:  -- No jugular venous distension  Pulmonary:  -- No crackles  -- Equal breath sounds bilaterally -- Breathing non-labored at rest Cardiovascular:  -- S1, S2 present  -- No pericardial rubs Gastrointestinal:  -- Abdomen soft, with tenderness on right lower quadrant, non-distended, no guarding or rebound tenderness -- No abdominal masses appreciated, pulsatile or otherwise  Musculoskeletal and Integumentary:  -- Wounds or skin discoloration: None appreciated -- Extremities: B/L UE and LE FROM, hands and feet warm, no edema  Neurologic:  -- Motor function: intact and symmetric -- Sensation: intact and symmetric   Labs:  CBC Latest Ref Rng & Units 08/09/2018  WBC 4.0 - 10.5 K/uL 10.6(H)  Hemoglobin 13.0 - 17.0 g/dL 16.1  Hematocrit 09.6 - 52.0 % 43.3  Platelets 150 - 400 K/uL 288   CMP Latest Ref Rng & Units 08/09/2018  Glucose 70 - 99 mg/dL 045(W)  BUN 6 - 20 mg/dL 12  Creatinine 0.98 - 1.19 mg/dL 1.47  Sodium 829 - 562 mmol/L 140  Potassium 3.5 - 5.1 mmol/L 3.6  Chloride 98 - 111 mmol/L 105  CO2 22 - 32 mmol/L 26  Calcium 8.9 - 10.3 mg/dL 9.3  Total Protein 6.5 - 8.1 g/dL 7.6  Total Bilirubin 0.3 - 1.2 mg/dL 0.8  Alkaline Phos 38 - 126 U/L 85  AST 15 - 41 U/L 27  ALT 0 - 44 U/L 53(H)     Imaging studies:  EXAM: CT ABDOMEN AND PELVIS WITH CONTRAST  TECHNIQUE: Multidetector CT imaging of the abdomen and pelvis was  performed using the standard protocol following bolus administration of intravenous contrast.  CONTRAST:  OMNIPAQUE IOHEXOL 300 MG/ML  SOLN  COMPARISON:  None.  FINDINGS: Lower chest: No acute abnormality.  Hepatobiliary: No focal liver abnormality is seen. Mild periportal edema. No gallstones, gallbladder wall thickening, or biliary dilatation.  Pancreas: Unremarkable. No pancreatic ductal dilatation or surrounding inflammatory changes.  Spleen: Normal in size without focal abnormality.  Adrenals/Urinary Tract: Adrenal glands are unremarkable. Kidneys are normal, without renal calculi, focal lesion, or hydronephrosis. 1.3 cm simple cyst arising from the lower pole of the left kidney. Bladder is unremarkable.  Stomach/Bowel: Dilated appendix with mild periappendiceal fat stranding, consistent with acute appendicitis.  Appendix: Location: Retrocecal  Diameter: 12 mm  Appendicolith: Present in the proximal appendix.  Mucosal hyper-enhancement: Present  Extraluminal gas: None.  Periappendiceal collection: None.  The stomach and small bowel are unremarkable. No obstruction. Prior partial sigmoid colon resection. Mild sigmoid diverticulosis.  Vascular/Lymphatic: No significant vascular findings are present. No enlarged abdominal or pelvic lymph nodes.  Reproductive: Prostate is unremarkable.  Other: No abdominal wall hernia or abnormality. No abdominopelvic ascites. No pneumoperitoneum.  Musculoskeletal: No acute or significant osseous findings.  IMPRESSION: 1. Acute appendicitis. 2. Prior partial sigmoid colon resection. Mild sigmoid diverticulosis without evidence of acute diverticulitis.   Electronically Signed   By: Obie Dredge M.D.   On: 08/09/2018 11:22   Assessment/Plan:  37 y.o. male with acute appendicitis.  Patient with history, physical exam and images consistent with acute appendicitis. Patient oriented about  diagnosis and surgical management as treatment. Patient oriented about goals of surgery and its risk including: bowel injury, infection, abscess, bleeding, leak from cecum, intestinal adhesions, bowel obstruction, fistula, injury to the ureter among others.  Due to patient history of colon surgery, adhesions may cause surgery to be more difficult and this was explained to patient. Patient understood and agreed to proceed with surgery. Will admit patient, already started on antibiotic therapy, will give IV hydration since patient is NPO and schedule to OR.   Gae Gallop, MD

## 2018-08-09 NOTE — ED Notes (Signed)
Pt reports to be vomiting - Dr Cyril Loosen ordered Zofran to be given at this time

## 2018-08-10 ENCOUNTER — Encounter: Payer: Self-pay | Admitting: General Surgery

## 2018-08-10 MED ORDER — HYDROCODONE-ACETAMINOPHEN 5-325 MG PO TABS: 1.0000 | ORAL_TABLET | ORAL | 0 refills | Status: AC | PRN

## 2018-08-10 NOTE — Discharge Instructions (Signed)

## 2018-08-10 NOTE — Progress Notes (Signed)
Discharge order received. Patient is alert and oriented. Vital signs stable . No signs of acute distress. Discharge instructions given. Patient verbalized understanding. No other issues noted at this time.   

## 2018-08-10 NOTE — Discharge Summary (Signed)
  Patient ID: Alec Arnold MRN: [id number] DOB/AGE: [birth year] 39 y.o.  Admit date: 08/09/2018 Discharge date: 08/10/2018   Discharge Diagnoses:  Active Problems:   Acute appendicitis with localized peritonitis   Procedures: Laparoscopic appendectomy  Hospital Course: Patient underwent laparoscopic appendectomy. Tolerated procedure well. This morning tolerated breakfast. He refers that he feels wonderful. Pain controlled. Voiding spontaneously. Recovering well.   Physical Exam  Constitutional: He is well-developed, well-nourished, and in no distress.  HENT:  Head: Normocephalic.  Cardiovascular: Normal rate and regular rhythm.  Pulmonary/Chest: Effort normal.  Abdominal: Soft. Bowel sounds are normal. He exhibits distension. There is no tenderness. There is no rebound.  Wounds are dry and clean.    Consults: None   Disposition: Discharge disposition: 01-Home or Self Care       Discharge Instructions    Diet - low sodium heart healthy   Complete by:  As directed    Increase activity slowly   Complete by:  As directed      Allergies as of 08/10/2018      Reactions   Penicillins Hives   Has patient had a PCN reaction causing immediate rash, facial/tongue/throat swelling, SOB or lightheadedness with hypotension: Yes Has patient had a PCN reaction causing severe rash involving mucus membranes or skin necrosis: No Has patient had a PCN reaction that required hospitalization: No Has patient had a PCN reaction occurring within the last 10 years: No If all of the above answers are "NO", then may proceed with Cephalosporin use.      Medication List    TAKE these medications   HYDROcodone-acetaminophen 5-325 MG tablet Commonly known as:  NORCO/VICODIN Take 1 tablet by mouth every 4 (four) hours as needed for moderate pain.      Follow-up Information    Carolan Shiver, MD Follow up in 2 week(s).   Specialty:  General Surgery Contact information: [address] [place] [address] [phone]

## 2018-08-11 LAB — HIV ANTIBODY (ROUTINE TESTING W REFLEX): HIV SCREEN 4TH GENERATION: NONREACTIVE

## 2018-08-13 LAB — SURGICAL PATHOLOGY

## 2021-11-28 ENCOUNTER — Emergency Department
Admission: EM | Admit: 2021-11-28 | Discharge: 2021-11-28 | Disposition: A | Discharge status: Home / Self Care | Payer: 59 | Attending: Emergency Medicine | Admitting: Emergency Medicine

## 2021-11-28 ENCOUNTER — Other Ambulatory Visit: Payer: Self-pay

## 2021-11-28 ENCOUNTER — Emergency Department: Payer: 59

## 2021-11-28 ENCOUNTER — Encounter: Payer: Self-pay | Admitting: Emergency Medicine

## 2021-11-28 DIAGNOSIS — R0789 Other chest pain: Secondary | ICD-10-CM | POA: Diagnosis not present

## 2021-11-28 DIAGNOSIS — R202 Paresthesia of skin: Secondary | ICD-10-CM | POA: Insufficient documentation

## 2021-11-28 DIAGNOSIS — R200 Anesthesia of skin: Secondary | ICD-10-CM | POA: Diagnosis present

## 2021-11-28 HISTORY — DX: Pure hypercholesterolemia, unspecified: E78.00

## 2021-11-28 HISTORY — DX: Gout, unspecified: M10.9

## 2021-11-28 HISTORY — DX: Diverticulitis of intestine, part unspecified, without perforation or abscess without bleeding: K57.92

## 2021-11-28 LAB — CBC WITH DIFFERENTIAL/PLATELET
Abs Immature Granulocytes: 0.03 10*3/uL (ref 0.00–0.07)
Basophils Absolute: 0.1 10*3/uL (ref 0.0–0.1)
Basophils Relative: 1 %
Eosinophils Absolute: 0.2 10*3/uL (ref 0.0–0.5)
Eosinophils Relative: 3 %
HCT: 42.3 % (ref 39.0–52.0)
Hemoglobin: 15 g/dL (ref 13.0–17.0)
Immature Granulocytes: 0 %
Lymphocytes Relative: 22 %
Lymphs Abs: 1.7 10*3/uL (ref 0.7–4.0)
MCH: 30.8 pg (ref 26.0–34.0)
MCHC: 35.5 g/dL (ref 30.0–36.0)
MCV: 86.9 fL (ref 80.0–100.0)
Monocytes Absolute: 0.7 10*3/uL (ref 0.1–1.0)
Monocytes Relative: 9 %
Neutro Abs: 4.9 10*3/uL (ref 1.7–7.7)
Neutrophils Relative %: 65 %
Platelets: 219 10*3/uL (ref 150–400)
RBC: 4.87 MIL/uL (ref 4.22–5.81)
RDW: 12.5 % (ref 11.5–15.5)
WBC: 7.5 10*3/uL (ref 4.0–10.5)
nRBC: 0 % (ref 0.0–0.2)

## 2021-11-28 LAB — COMPREHENSIVE METABOLIC PANEL
ALT: 62 U/L — ABNORMAL HIGH (ref 0–44)
AST: 46 U/L — ABNORMAL HIGH (ref 15–41)
Albumin: 4.6 g/dL (ref 3.5–5.0)
Alkaline Phosphatase: 70 U/L (ref 38–126)
Anion gap: 10 (ref 5–15)
BUN: 14 mg/dL (ref 6–20)
CO2: 26 mmol/L (ref 22–32)
Calcium: 9.3 mg/dL (ref 8.9–10.3)
Chloride: 102 mmol/L (ref 98–111)
Creatinine, Ser: 0.87 mg/dL (ref 0.61–1.24)
GFR, Estimated: >60 mL/min (ref >60)
Glucose, Bld: 103 mg/dL — ABNORMAL HIGH (ref 70–99)
Potassium: 3.8 mmol/L (ref 3.5–5.1)
Sodium: 138 mmol/L (ref 135–145)
Total Bilirubin: 0.8 mg/dL (ref 0.3–1.2)
Total Protein: 7.7 g/dL (ref 6.5–8.1)

## 2021-11-28 LAB — TROPONIN I (HIGH SENSITIVITY)
Troponin I (High Sensitivity): 3 ng/L (ref <18)
Troponin I (High Sensitivity): 3 ng/L (ref <18)

## 2021-11-28 IMAGING — MR MR HEAD W/O CM
14 of 16 series · 46 of 48 positions shown · non-contrast
Comparison: None.

CLINICAL DATA: Transient ischemic attack (TIA); Stroke/TIA,
determine embolic source



[Series 5: ax dwi_tracew · axial · 3.0mm · 0.65mm/px · z∈[-98,+57]mm · 3 of 48 slices shown]
[im 1/48]
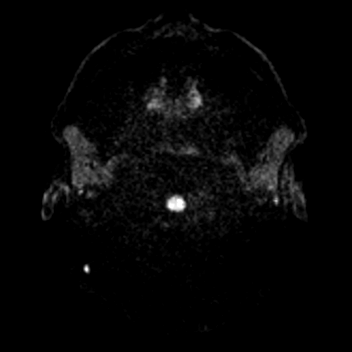
[im 24/48]
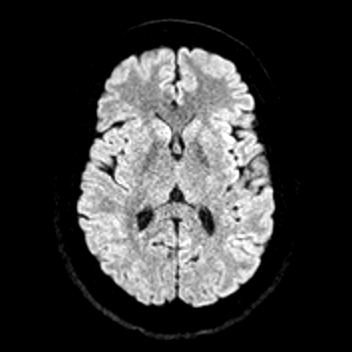
[im 48/48]
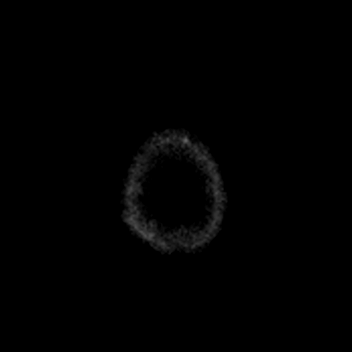

[Series 6: ax dwi_adc · axial · 3.0mm · 0.65mm/px · z∈[-98,+57]mm · 2 of 48 slices shown]
[im 1/48]
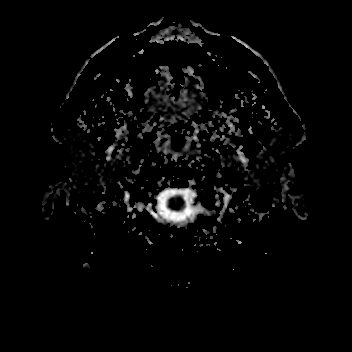
[im 48/48]
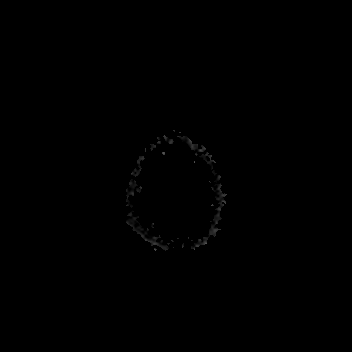

[Series 7: cor dwi_tracew · coronal · 5.0mm · 0.68mm/px · 2 of 40 slices shown]
[im 1/40]
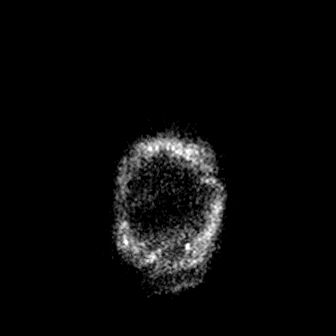
[im 40/40]
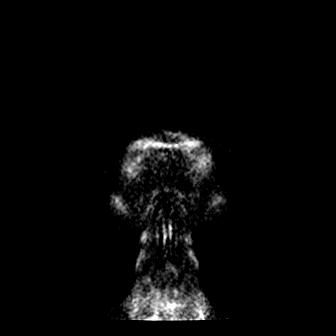

[Series 8: cor dwi_adc · coronal · 5.0mm · 0.68mm/px · 2 of 40 slices shown]
[im 1/40]
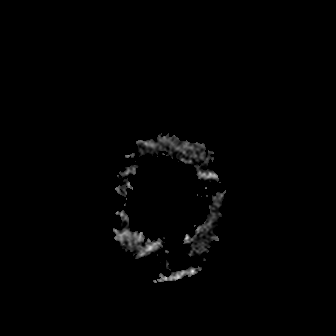
[im 40/40]
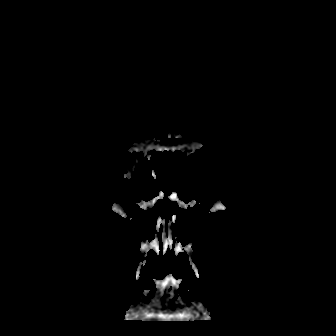

[Series 9: T1 · sagittal · 5.0mm · 0.62mm/px · 1 of 25 slices shown (1 of 2)]
[im 1/25]
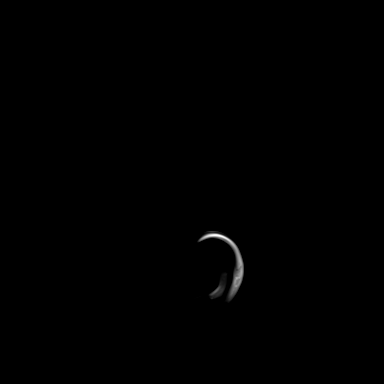

[Series 10: T2 · axial · 5.0mm · 0.53mm/px · 1 of 25 slices shown (1 of 2)]
[im 1/25]
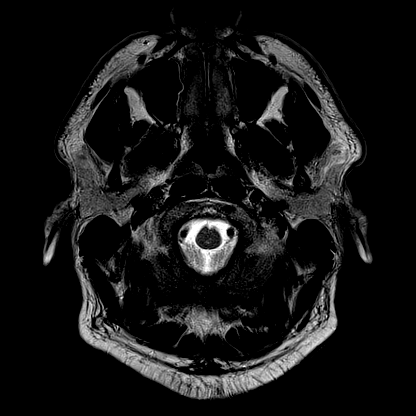

[Series 11: mag_images · axial · 3.0mm · 0.90mm/px · z∈[-109,+68]mm · 3 of 60 slices shown]
[im 1/60]
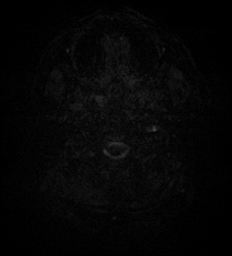
[im 30/60]
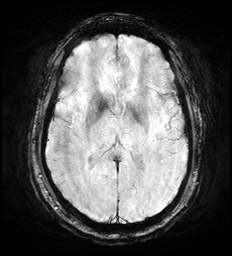
[im 60/60]
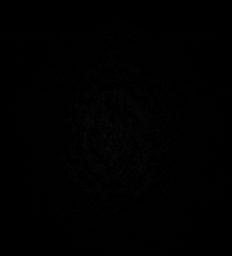

[Series 12: pha_images · axial · 3.0mm · 0.90mm/px · z∈[-109,+68]mm · 3 of 59 slices shown]
[im 1/59]
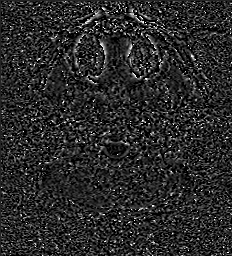
[im 30/59]
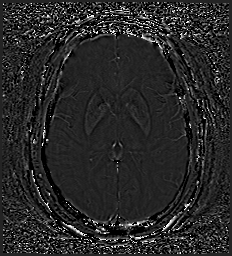
[im 59/59]
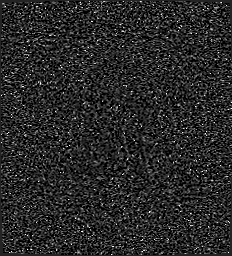

[Series 13: swi_images · axial · 3.0mm · 0.90mm/px · z∈[-109,+68]mm · 3 of 60 slices shown]
[im 1/60]
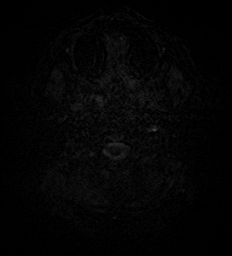
[im 30/60]
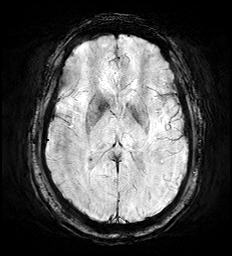
[im 60/60]
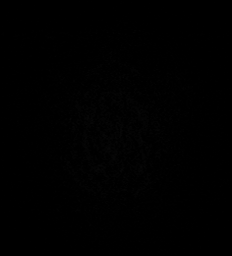

[Series 14: mip_images(sw) · axial · 24.0mm · 0.90mm/px · z∈[-99,+57]mm · 2 of 53 slices shown]
[im 1/53]
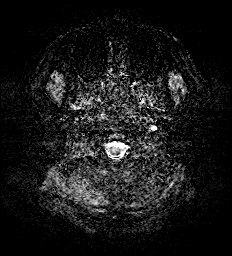
[im 53/53]
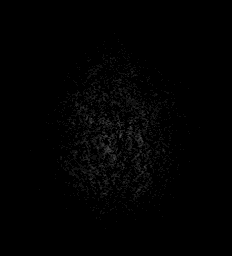

[Series 15: FLAIR · axial · 3.0mm · 0.53mm/px · z∈[-102,+60]mm · 3 of 55 slices shown]
[im 1/55]
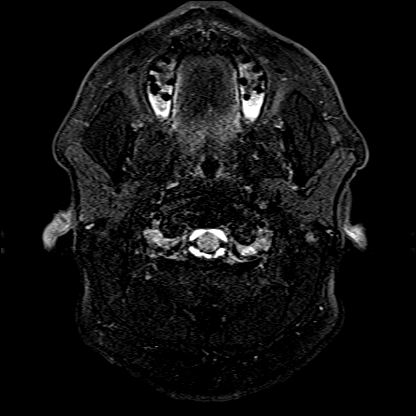
[im 28/55]
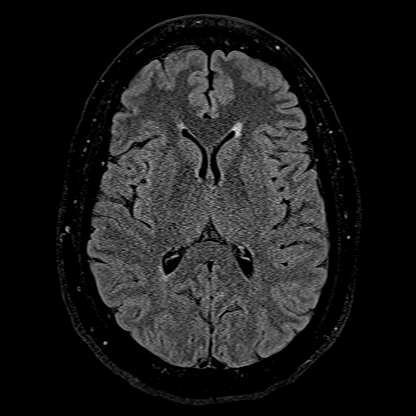
[im 55/55]
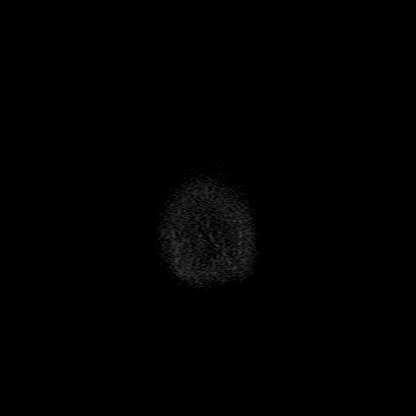

[Series 16: T1 · axial · 1.0mm · 0.98mm/px · z∈[-108,+67]mm · 8 of 176 slices shown (2 of 2)]
[im 1/176]
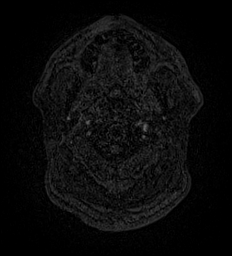
[im 26/176]
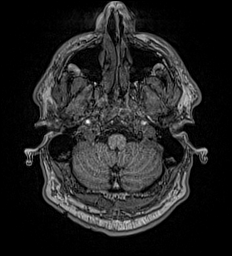
[im 51/176]
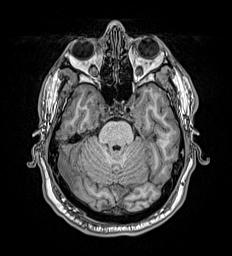
[im 76/176]
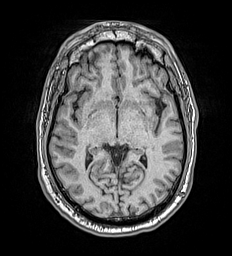
[im 101/176]
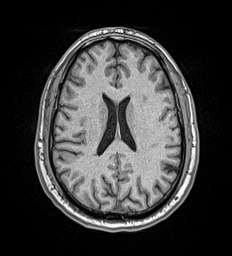
[im 126/176]
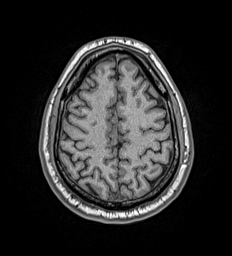
[im 151/176]
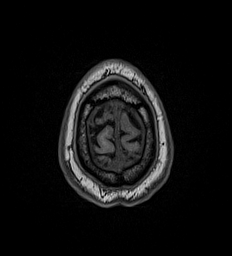
[im 176/176]
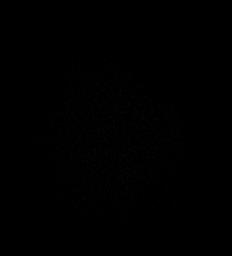

[Series 17: T2 · coronal · 5.0mm · 0.86mm/px · 1 of 30 slices shown (2 of 2)]
[im 1/30]
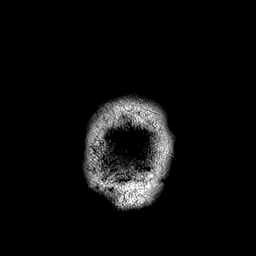

[Series 22: TOF post-contrast · axial · non-contrast · 0.8mm · 0.52mm/px · z∈[-301,-95]mm · 12 of 259 slices shown]
[im 1/259]
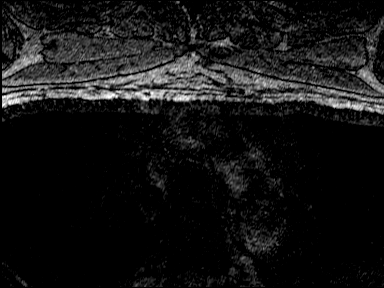
[im 24/259]
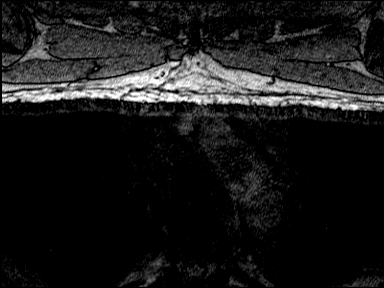
[im 47/259]
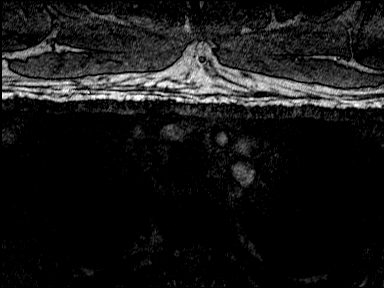
[im 71/259]
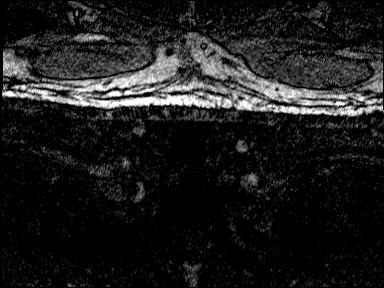
[im 94/259]
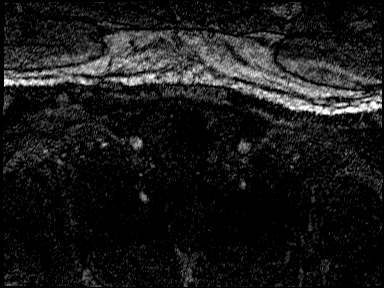
[im 118/259]
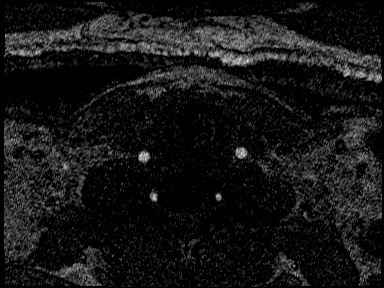
[im 141/259]
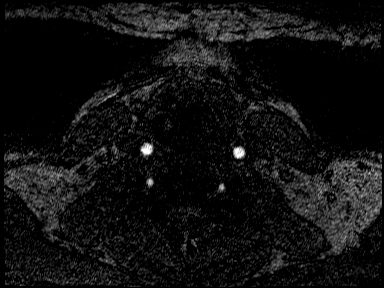
[im 165/259]
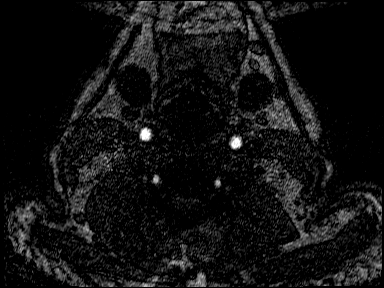
[im 188/259]
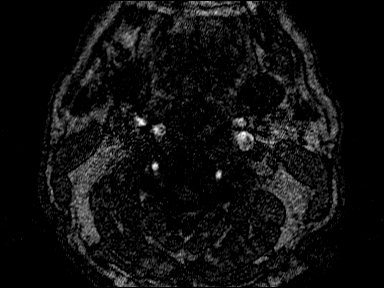
[im 212/259]
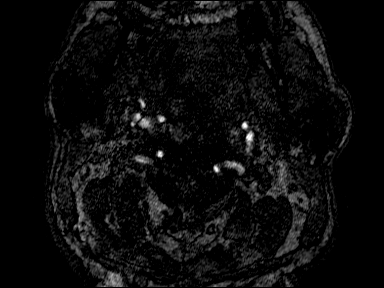
[im 235/259]
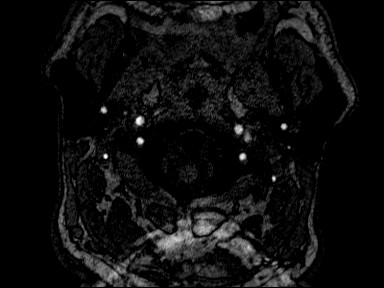
[im 259/259]
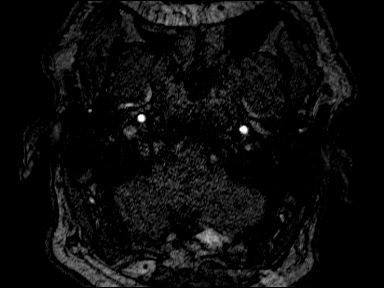

[46 of 48 positions shown; findings below may reference images not displayed]

FINDINGS: MRI HEAD FINDINGS

Brain: No acute infarction, hemorrhage, hydrocephalus, extra-axial
collection or mass lesion. Mild scattered T2/FLAIR hyperintensities
in the white matter.

Vascular: See below.

Skull and upper cervical spine: Normal marrow signal.

Sinuses/Orbits: Negative.

Other: No mastoid effusions.

MRA HEAD FINDINGS

Mildly motion limited.

Anterior circulation: Bilateral intracranial ICAs MCAs and ACAs are
patent without proximal hemodynamically significant stenosis. No
aneurysm identified.

Posterior circulation: Bilateral intradural vertebral arteries,
basilar artery, and posterior cerebral arteries are patent without
proximal hemodynamically significant stenosis. Left posterior
communicating artery present with suspected fenestration of the
proximal left PCA, anatomic variant.

MRA NECK FINDINGS

Nondiagnostic evaluation proximally.

Carotid system: Limited study due to motion and artifact. Bilateral
common carotid and internal carotid arteries are patent without
visible high-grade stenosis. Evaluation is particularly limited
proximally and at the carotid bifurcations.

Vertebral arteries: Limited evaluation due to artifact. Bilateral
vertebral arteries appear patent without visible high-grade
stenosis.
IMPRESSION: MRI head:

1. No evidence of acute intracranial abnormality.
2. Mild scattered T2/FLAIR hyperintensities in the white matter,
nonspecific but considered within normal limits for patient age.

MRA head:

No large vessel occlusion or proximal hemodynamically significant
stenosis.

MRA neck:

Limited evaluation due to artifact without visible occlusion or
high-grade stenosis. CTA could provide more sensitive evaluation
clinically indicated.

## 2021-11-28 IMAGING — MR MR MRA NECK W/O CM
1 series · 41 of 48 positions shown · non-contrast
Comparison: None.

CLINICAL DATA: Transient ischemic attack (TIA); Stroke/TIA,
determine embolic source



[Series 22: TOF post-contrast · axial · non-contrast · 0.8mm · 0.52mm/px · z∈[-301,-103]mm · 41 of 259 slices shown]
[im 1/259]
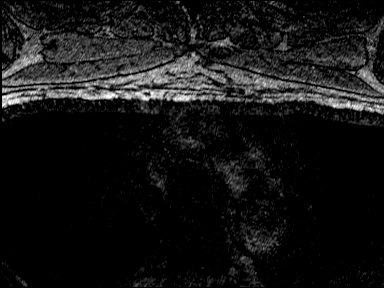
[im 6/259]
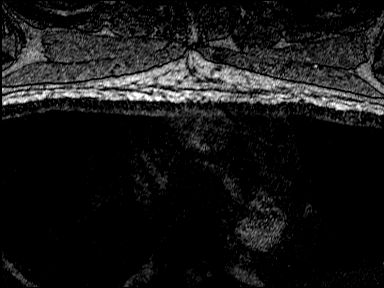
[im 11/259]
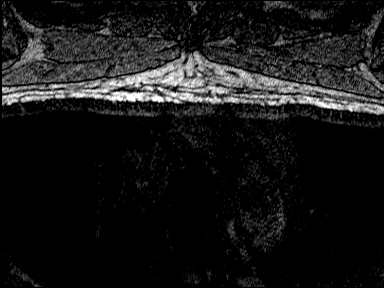
[im 17/259]
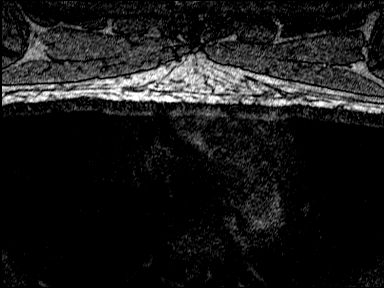
[im 22/259]
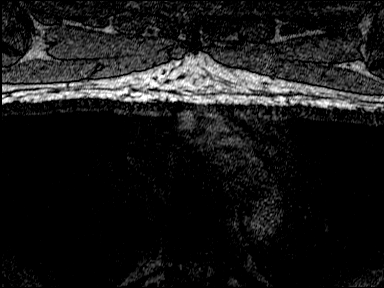
[im 28/259]
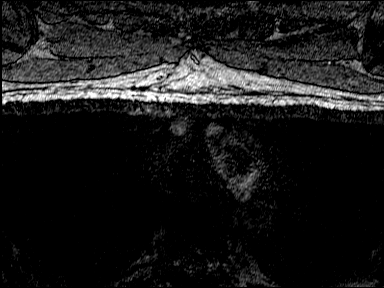
[im 33/259]
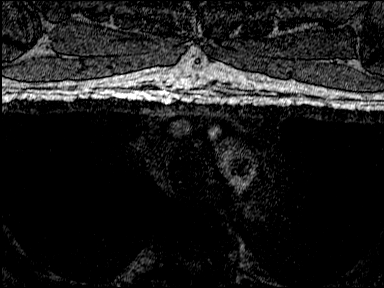
[im 39/259]
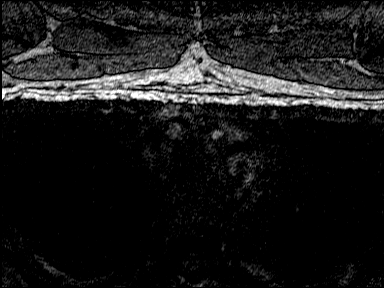
[im 44/259]
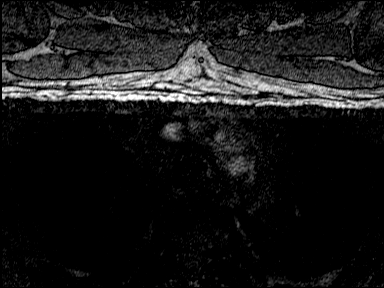
[im 50/259]
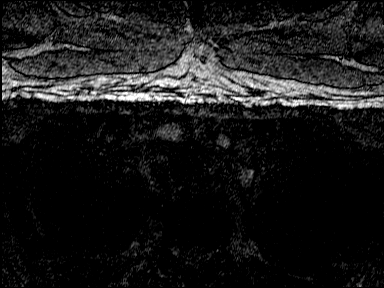
[im 55/259]
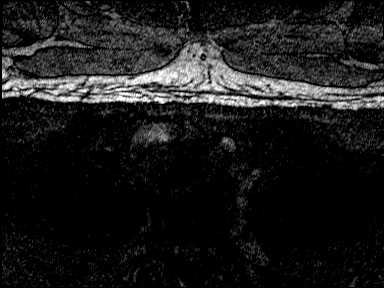
[im 61/259]
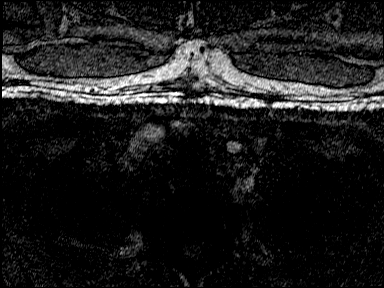
[im 66/259]
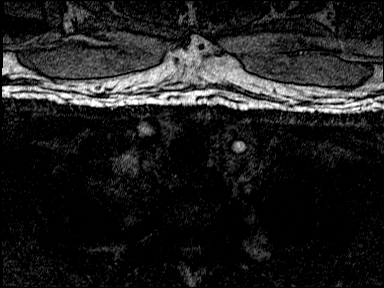
[im 72/259]
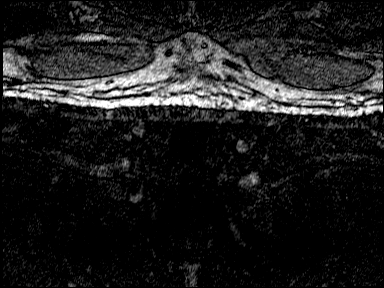
[im 77/259]
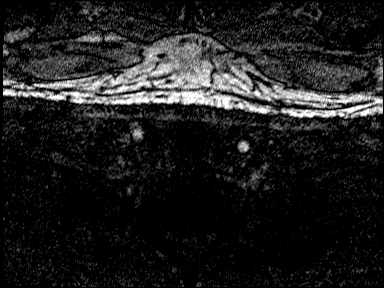
[im 83/259]
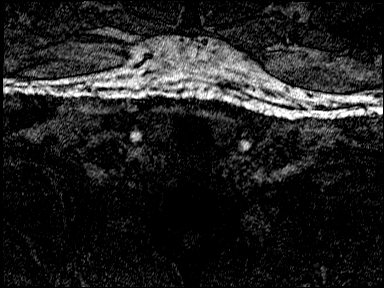
[im 88/259]
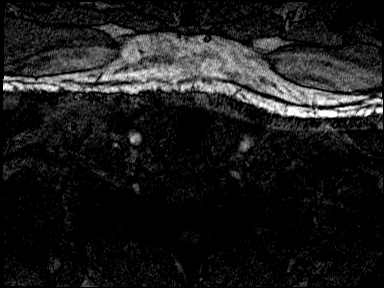
[im 94/259]
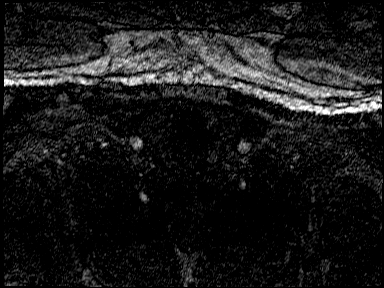
[im 99/259]
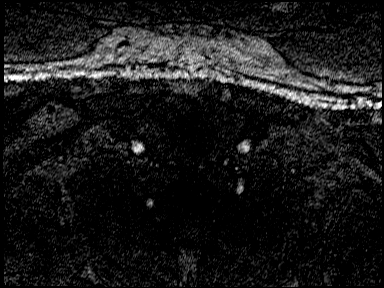
[im 105/259]
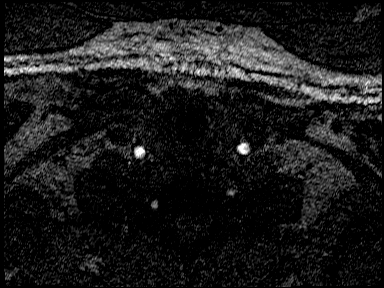
[im 110/259]
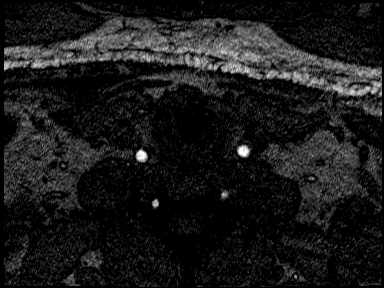
[im 116/259]
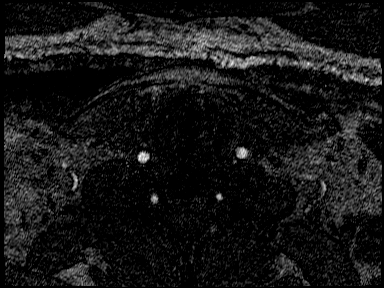
[im 121/259]
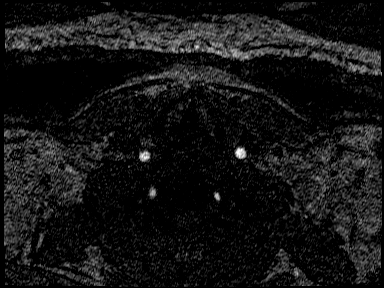
[im 127/259]
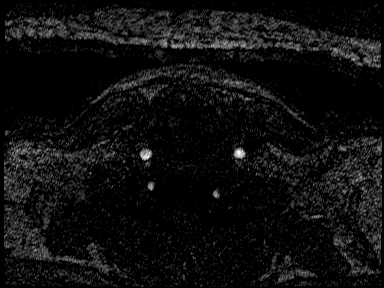
[im 132/259]
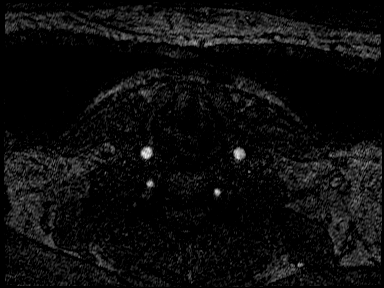
[im 138/259]
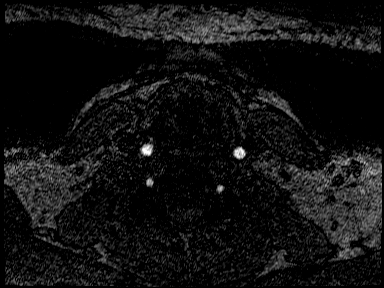
[im 143/259]
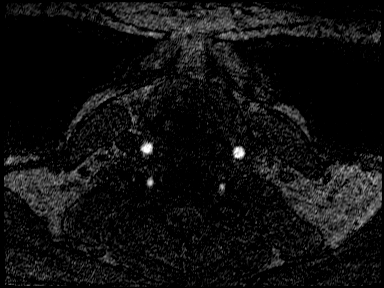
[im 149/259]
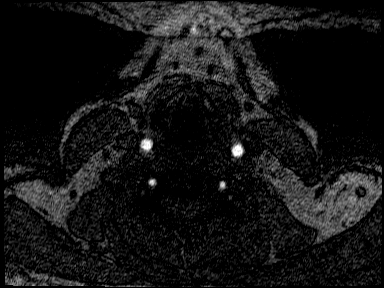
[im 154/259]
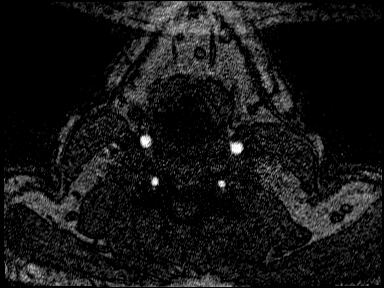
[im 160/259]
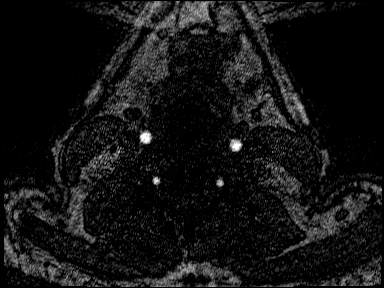
[im 165/259]
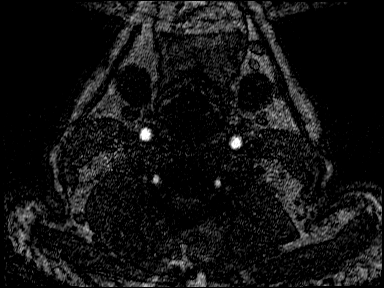
[im 171/259]
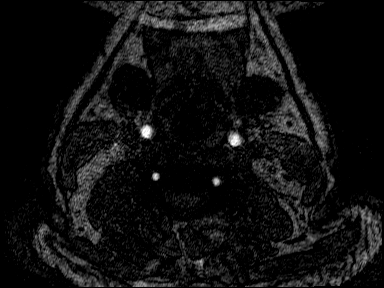
[im 176/259]
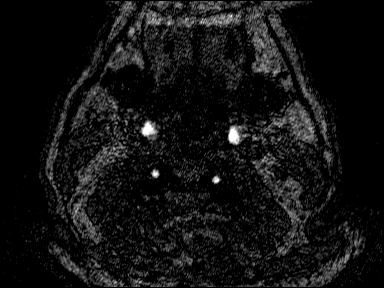
[im 182/259]
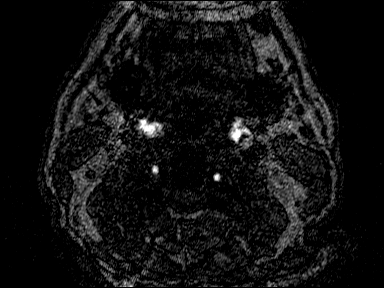
[im 187/259]
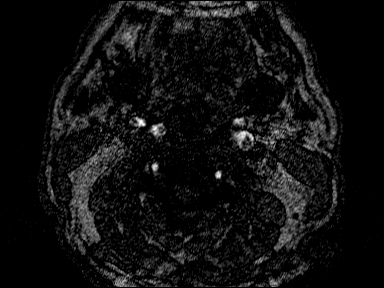
[im 193/259]
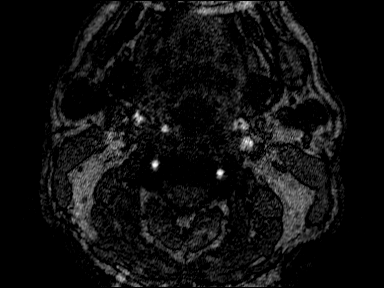
[im 198/259]
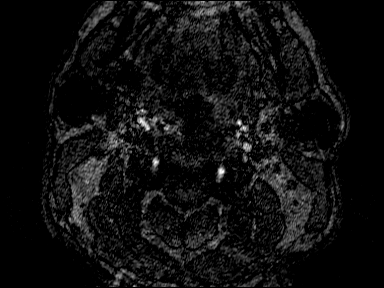
[im 204/259]
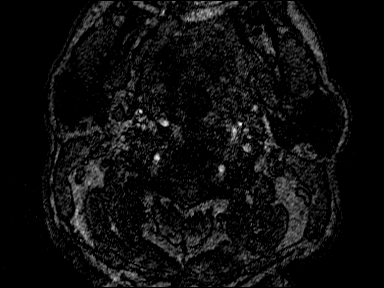
[im 215/259]
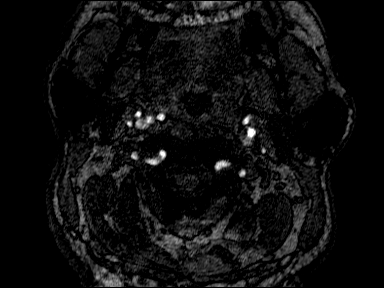
[im 220/259]
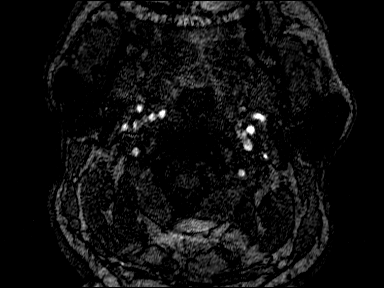
[im 248/259]
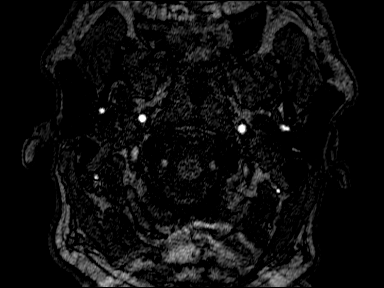

[41 of 48 positions shown; findings below may reference images not displayed]

FINDINGS: MRI HEAD FINDINGS

Brain: No acute infarction, hemorrhage, hydrocephalus, extra-axial
collection or mass lesion. Mild scattered T2/FLAIR hyperintensities
in the white matter.

Vascular: See below.

Skull and upper cervical spine: Normal marrow signal.

Sinuses/Orbits: Negative.

Other: No mastoid effusions.

MRA HEAD FINDINGS

Mildly motion limited.

Anterior circulation: Bilateral intracranial ICAs MCAs and ACAs are
patent without proximal hemodynamically significant stenosis. No
aneurysm identified.

Posterior circulation: Bilateral intradural vertebral arteries,
basilar artery, and posterior cerebral arteries are patent without
proximal hemodynamically significant stenosis. Left posterior
communicating artery present with suspected fenestration of the
proximal left PCA, anatomic variant.

MRA NECK FINDINGS

Nondiagnostic evaluation proximally.

Carotid system: Limited study due to motion and artifact. Bilateral
common carotid and internal carotid arteries are patent without
visible high-grade stenosis. Evaluation is particularly limited
proximally and at the carotid bifurcations.

Vertebral arteries: Limited evaluation due to artifact. Bilateral
vertebral arteries appear patent without visible high-grade
stenosis.
IMPRESSION: MRI head:

1. No evidence of acute intracranial abnormality.
2. Mild scattered T2/FLAIR hyperintensities in the white matter,
nonspecific but considered within normal limits for patient age.

MRA head:

No large vessel occlusion or proximal hemodynamically significant
stenosis.

MRA neck:

Limited evaluation due to artifact without visible occlusion or
high-grade stenosis. CTA could provide more sensitive evaluation
clinically indicated.

## 2021-11-28 IMAGING — CR DG CHEST 2V
1 series · 2 of 2 positions shown · non-contrast
Comparison: None.

CLINICAL DATA: Chest tightness.

EXAM:
CHEST - 2 VIEW

[Series 1: dg chest 2 view · 0.14mm/px · 2 of 2 slices shown]
[im 1/2]
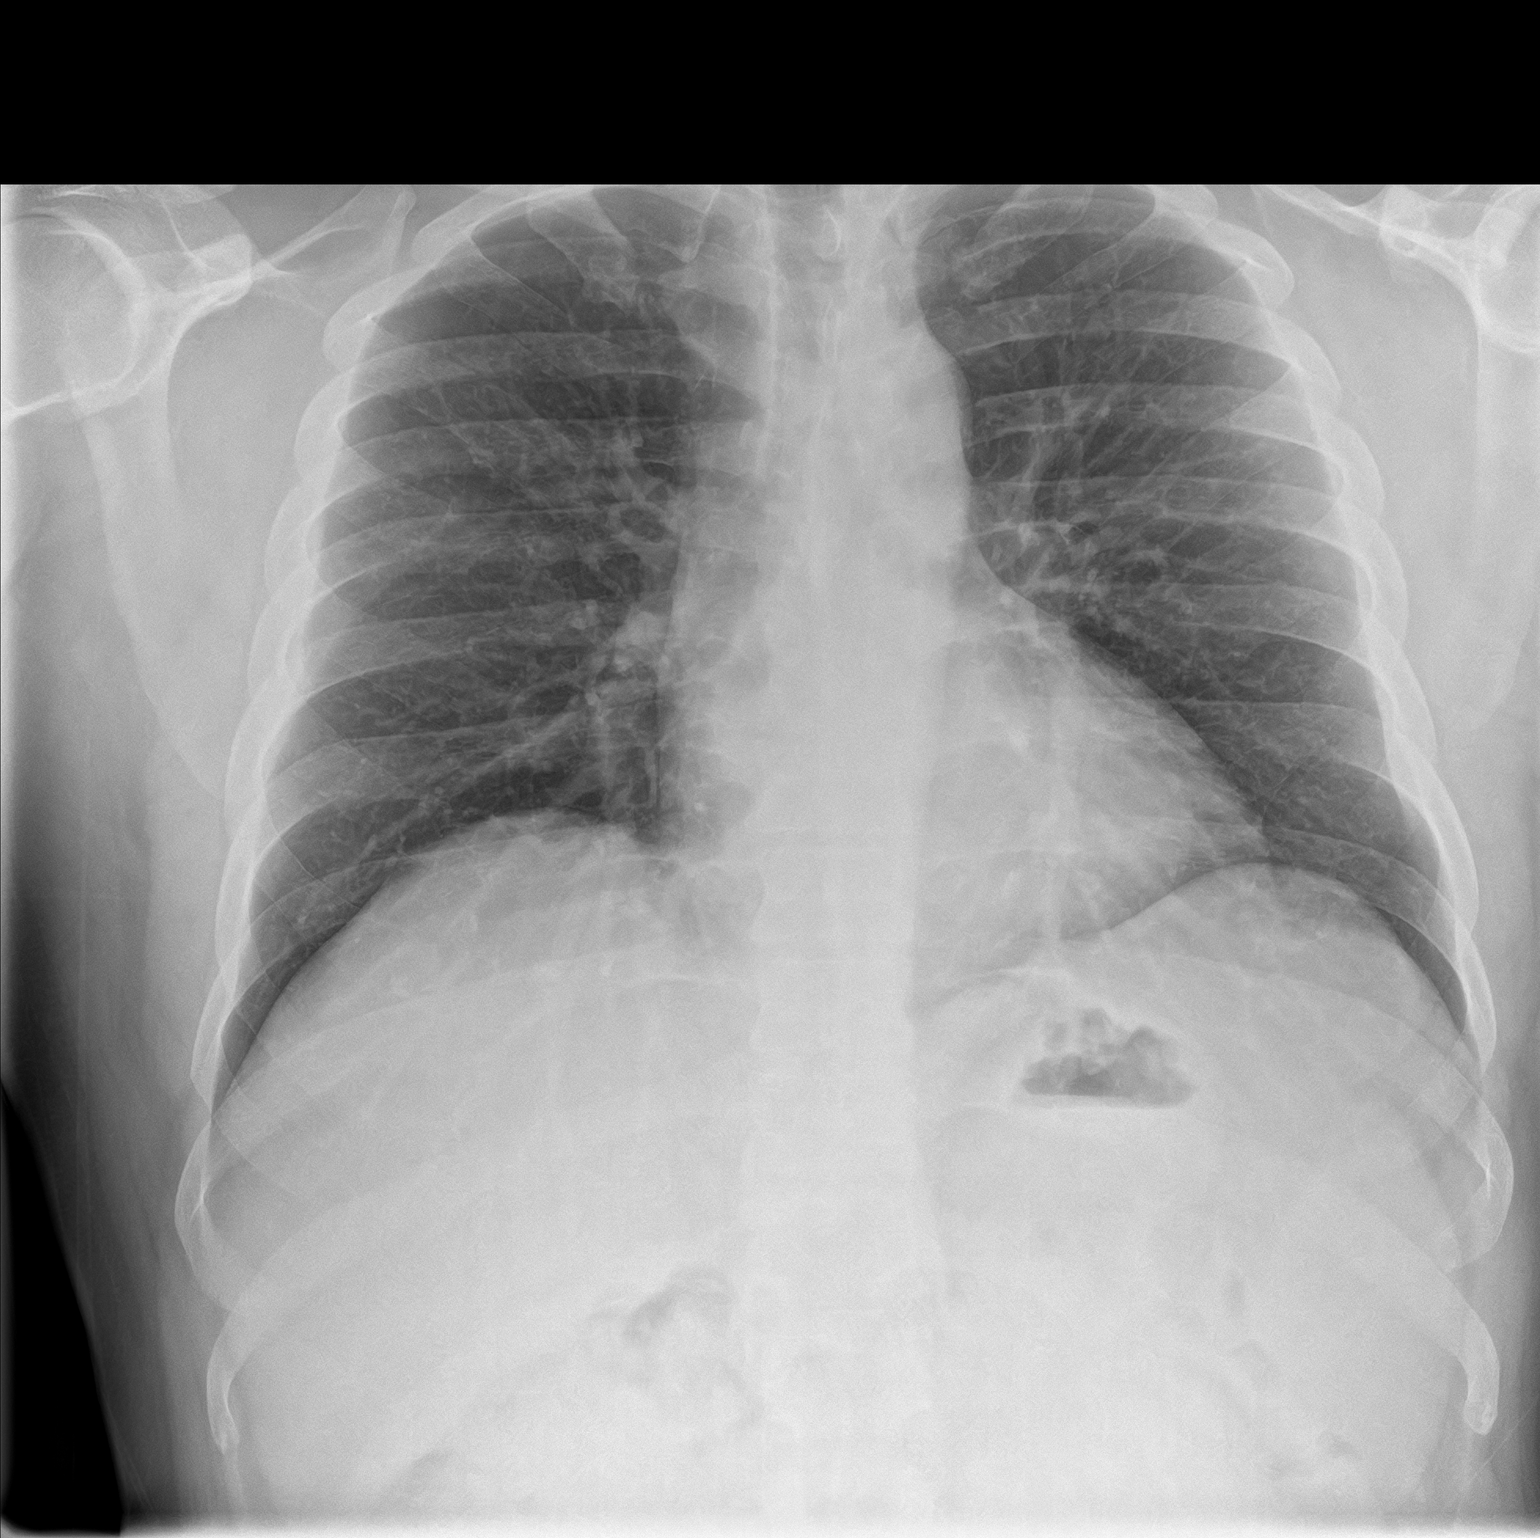
[im 2/2]
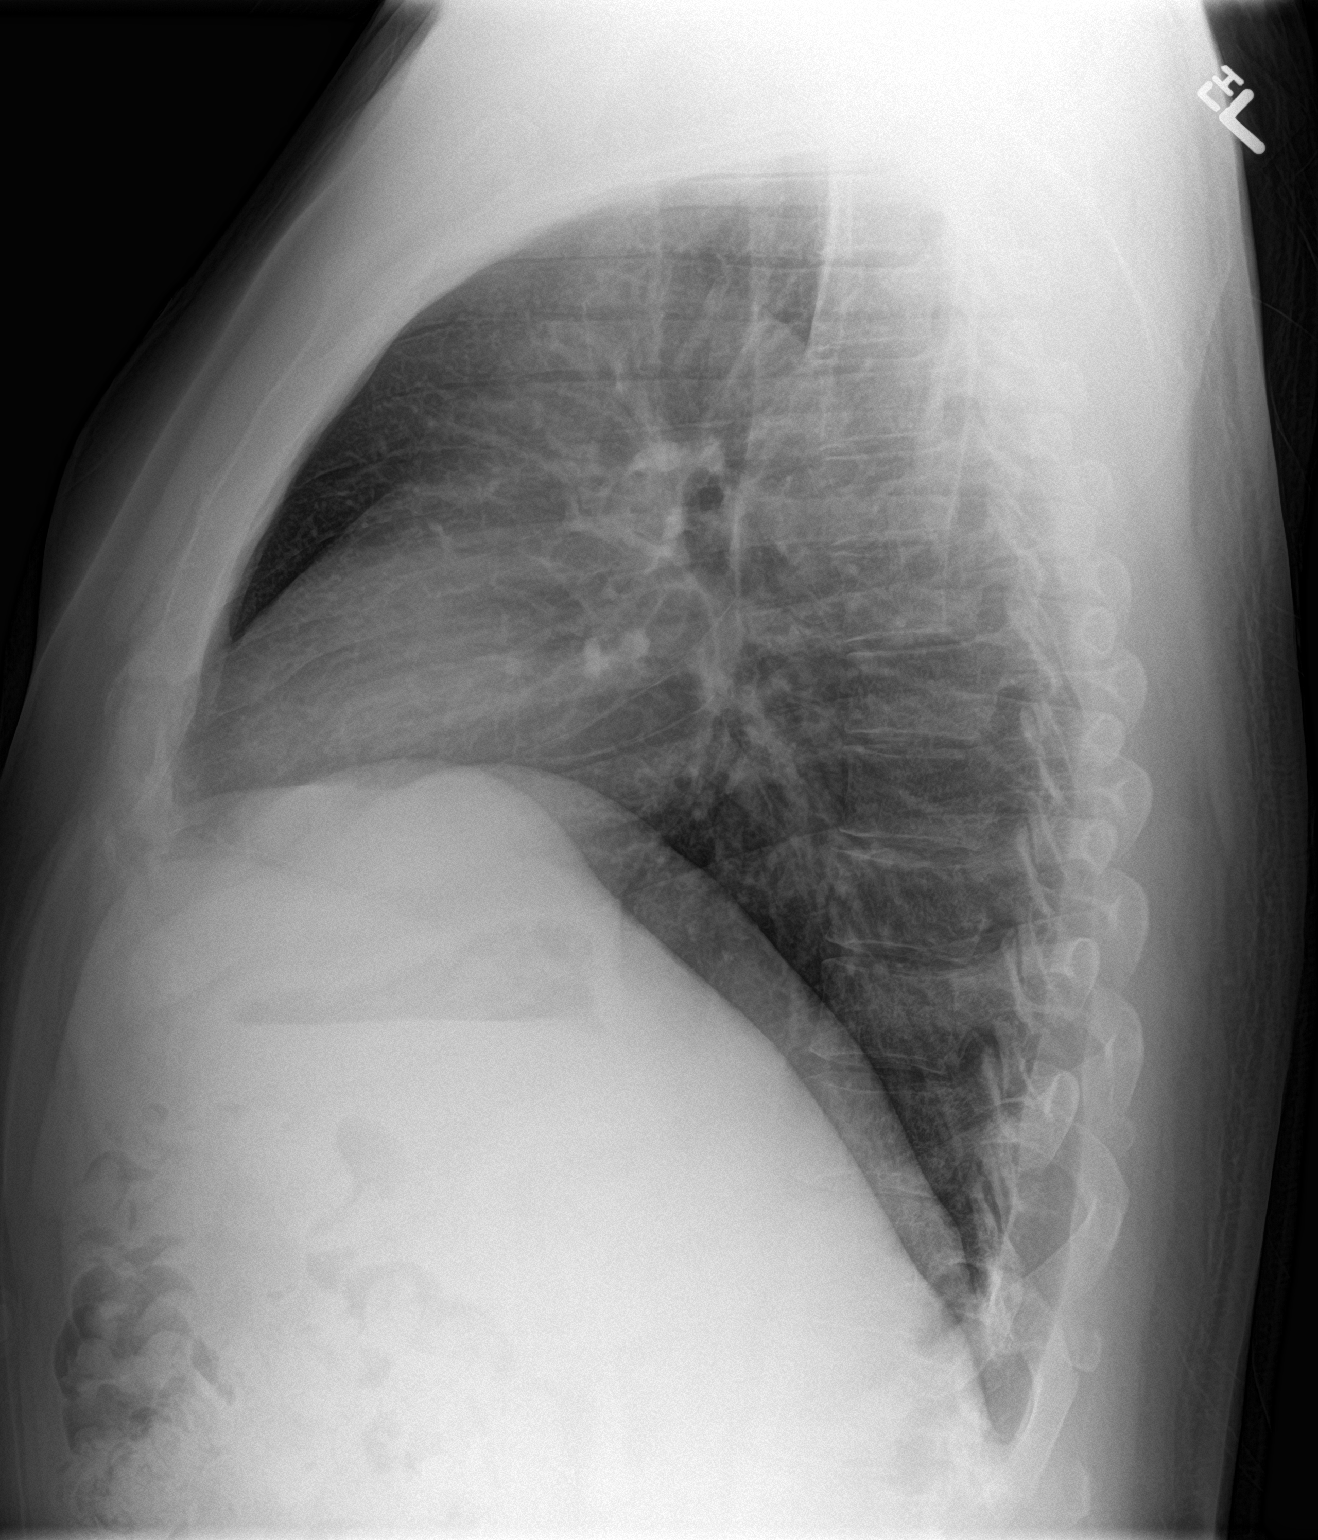

[2 of 2 positions shown; findings below may reference images not displayed]

FINDINGS: Heart size and mediastinal contours are unremarkable. There is no
pleural effusion or edema identified. No airspace consolidation
identified. The visualized osseous structures is unremarkable.
IMPRESSION: No active cardiopulmonary abnormalities.

## 2021-11-28 IMAGING — MR MR MRA HEAD W/O CM
1 series · 18 of 48 positions shown · non-contrast
Comparison: None.

CLINICAL DATA: Transient ischemic attack (TIA); Stroke/TIA,
determine embolic source



[Series 5: TOF · axial · 0.5mm · 0.41mm/px · z∈[-95,+3]mm · 18 of 205 slices shown]
[im 1/205]
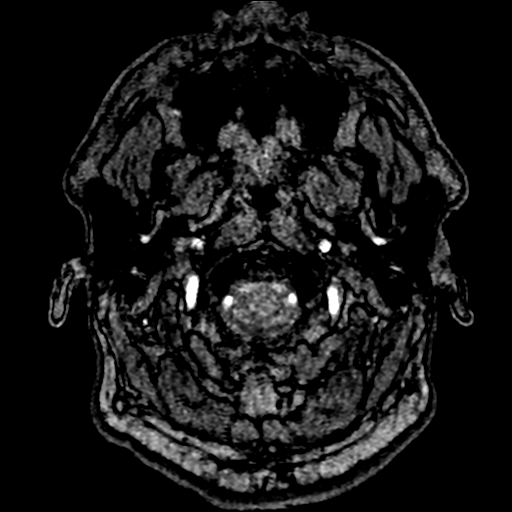
[im 5/205]
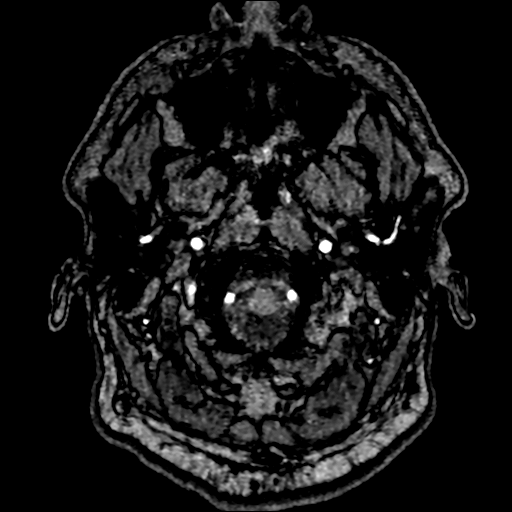
[im 9/205]
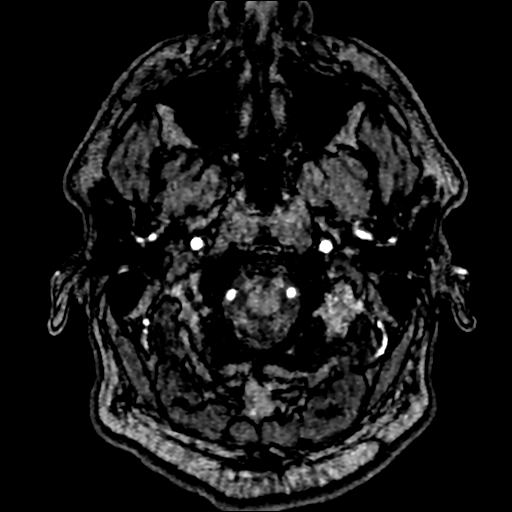
[im 14/205]
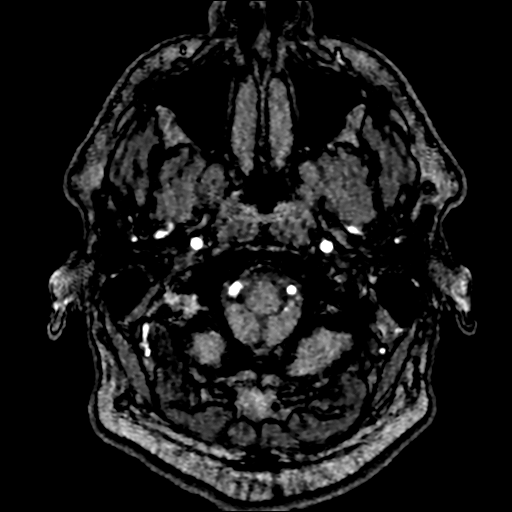
[im 18/205]
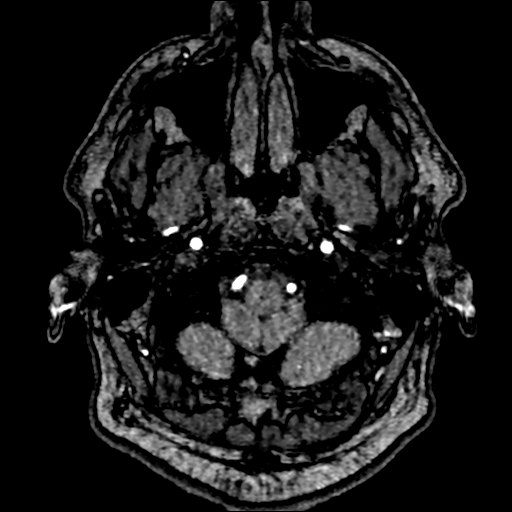
[im 22/205]
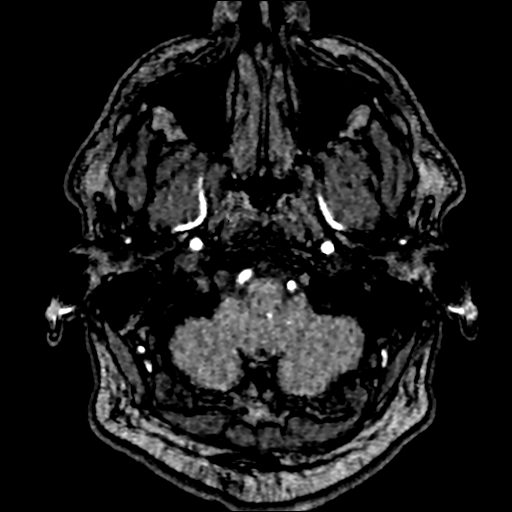
[im 27/205]
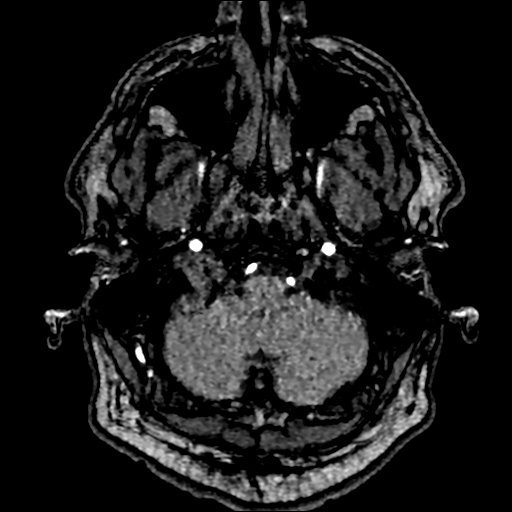
[im 31/205]
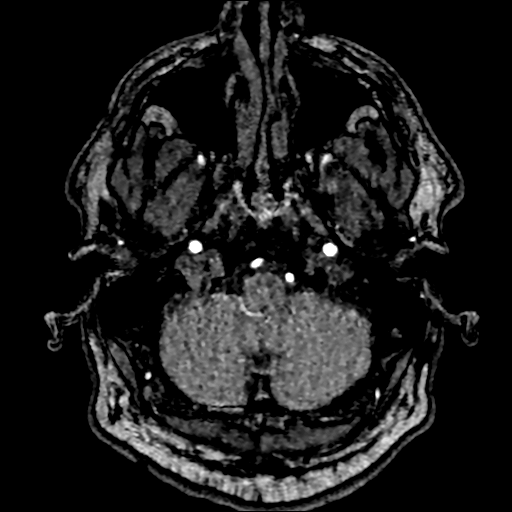
[im 35/205]
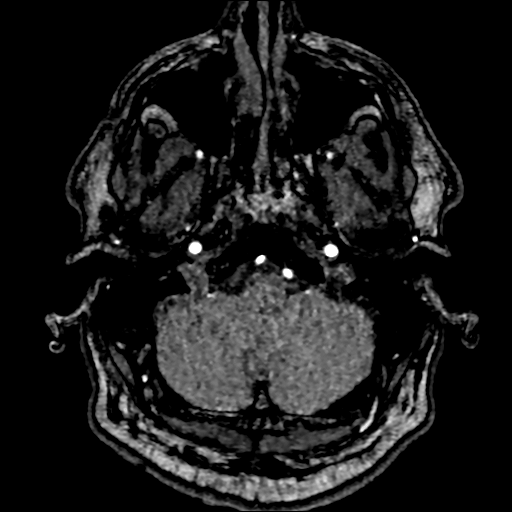
[im 40/205]
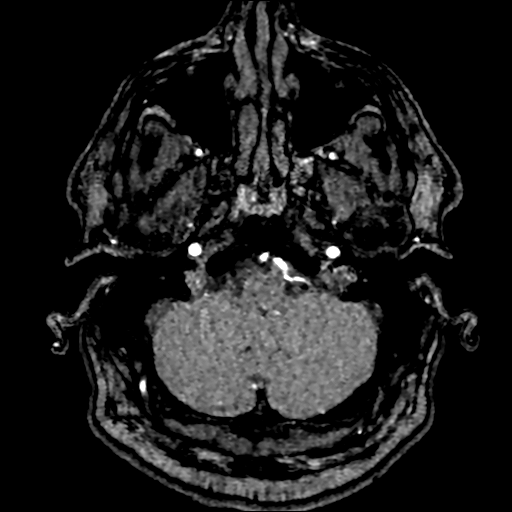
[im 66/205]
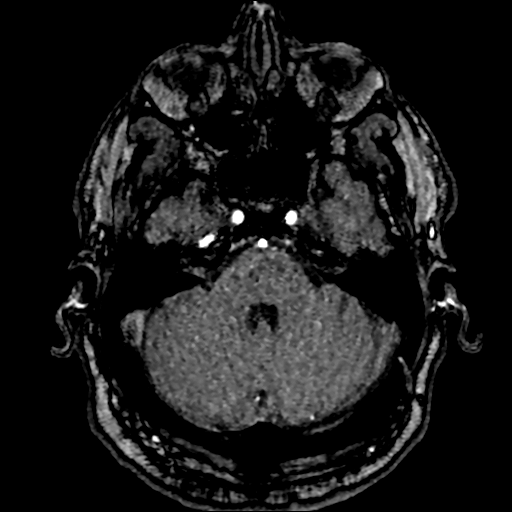
[im 92/205]
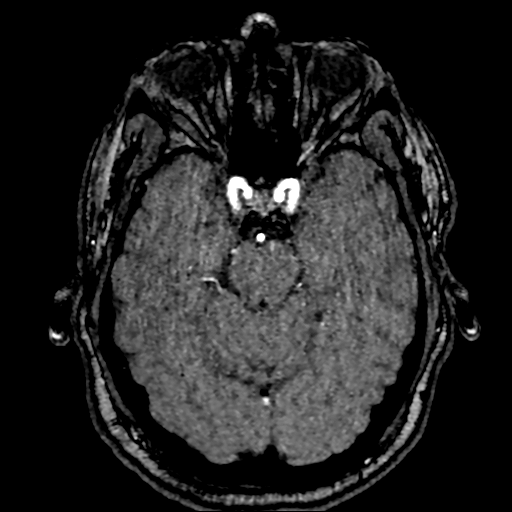
[im 105/205]
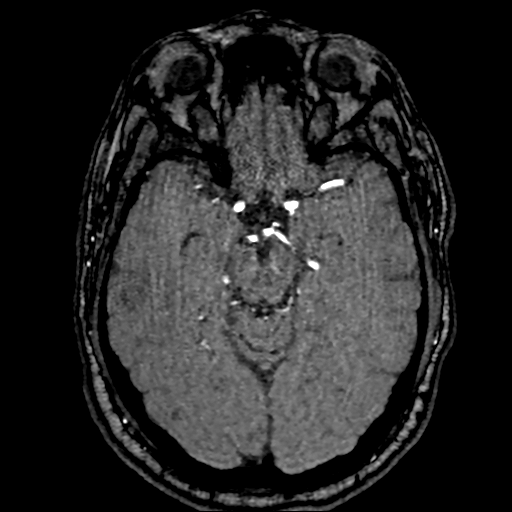
[im 118/205]
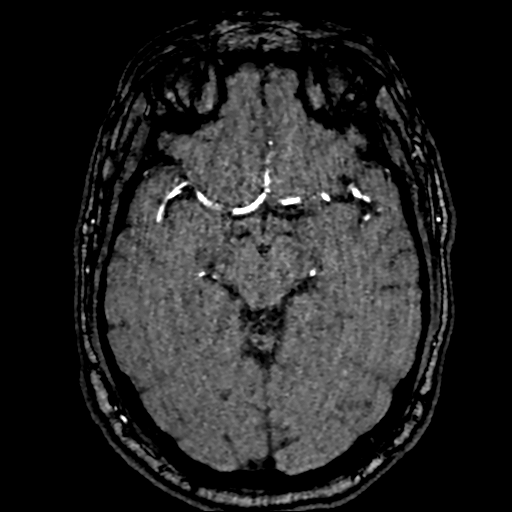
[im 144/205]
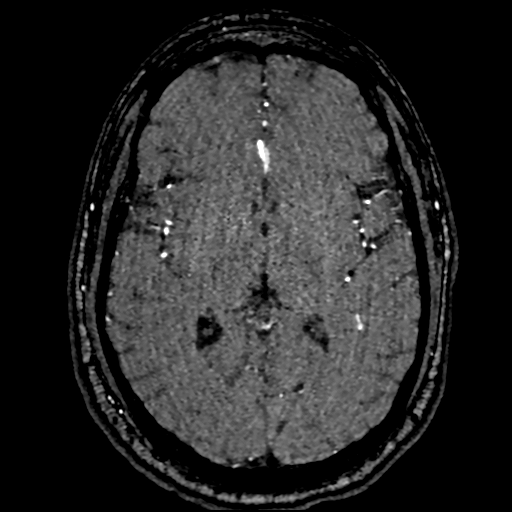
[im 170/205]
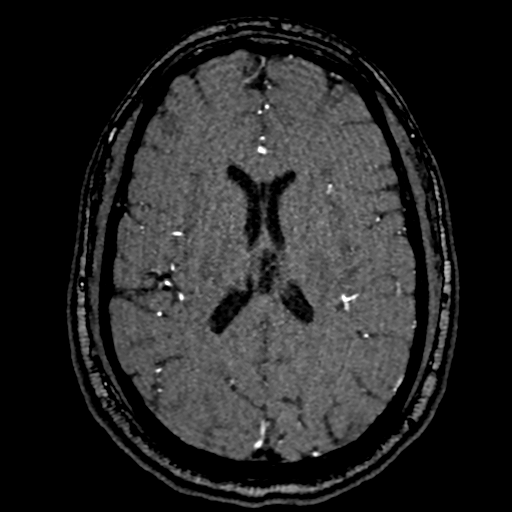
[im 174/205]
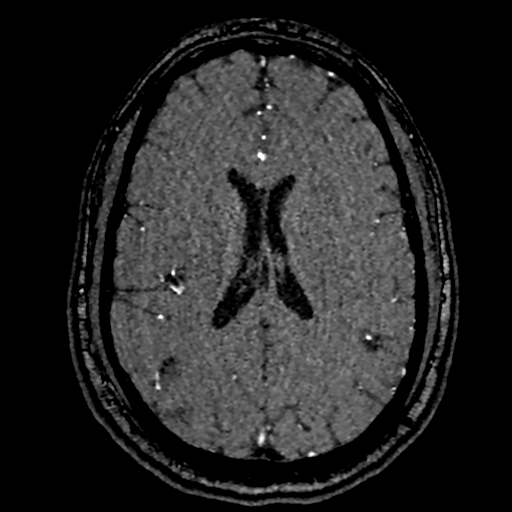
[im 196/205]
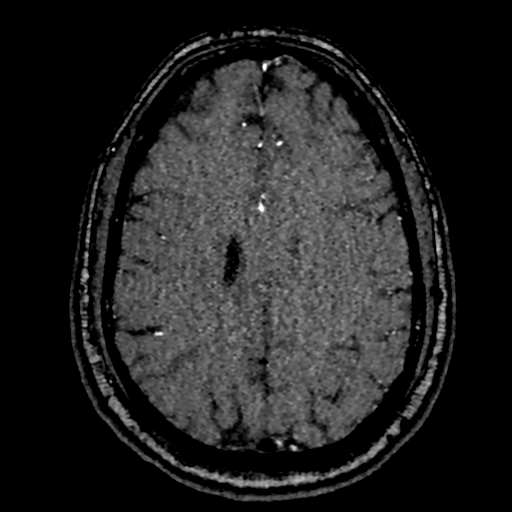

[18 of 48 positions shown; findings below may reference images not displayed]

FINDINGS: MRI HEAD FINDINGS

Brain: No acute infarction, hemorrhage, hydrocephalus, extra-axial
collection or mass lesion. Mild scattered T2/FLAIR hyperintensities
in the white matter.

Vascular: See below.

Skull and upper cervical spine: Normal marrow signal.

Sinuses/Orbits: Negative.

Other: No mastoid effusions.

MRA HEAD FINDINGS

Mildly motion limited.

Anterior circulation: Bilateral intracranial ICAs MCAs and ACAs are
patent without proximal hemodynamically significant stenosis. No
aneurysm identified.

Posterior circulation: Bilateral intradural vertebral arteries,
basilar artery, and posterior cerebral arteries are patent without
proximal hemodynamically significant stenosis. Left posterior
communicating artery present with suspected fenestration of the
proximal left PCA, anatomic variant.

MRA NECK FINDINGS

Nondiagnostic evaluation proximally.

Carotid system: Limited study due to motion and artifact. Bilateral
common carotid and internal carotid arteries are patent without
visible high-grade stenosis. Evaluation is particularly limited
proximally and at the carotid bifurcations.

Vertebral arteries: Limited evaluation due to artifact. Bilateral
vertebral arteries appear patent without visible high-grade
stenosis.
IMPRESSION: MRI head:

1. No evidence of acute intracranial abnormality.
2. Mild scattered T2/FLAIR hyperintensities in the white matter,
nonspecific but considered within normal limits for patient age.

MRA head:

No large vessel occlusion or proximal hemodynamically significant
stenosis.

MRA neck:

Limited evaluation due to artifact without visible occlusion or
high-grade stenosis. CTA could provide more sensitive evaluation
clinically indicated.

## 2021-11-28 MED ORDER — ASPIRIN 81 MG PO CHEW
324.0000 mg | CHEWABLE_TABLET | Freq: Once | ORAL | Status: AC
  Administered 2021-11-28: 324 mg via ORAL
  Filled 2021-11-28: qty 4

## 2021-11-28 NOTE — ED Provider Notes (Signed)
[facility] Provider Note    Event Date/Time   First MD Initiated Contact with Patient 11/28/21 [phone]     (approximate)   History   Numbness   HPI  Alec Arnold is a 41 y.o. male with a past medical history of gout, diverticulitis, and HDL who presents for evaluation of some chest tightness associated with numbness in the right arm rating up into the right neck.  He states this occurred when he was driving earlier this morning.  No prior similar episodes.  No clear leaving aggravating factors.  No analgesic medicines prior to arrival.  He denies any known cardiac or pulmonary history.  He currently vapes but does not currently smoke or use any illicit drugs or drink alcohol excessively recently.  No associated shortness of breath, back pain, fevers, cough, headache, earache, sore throat, vomiting, diarrhea, rash, urinary symptoms or any other areas of numbness tingling or weakness.      Physical Exam  Triage Vital Signs: ED Triage Vitals  Enc Vitals Group     BP 11/28/21 0925 (!) 154/104     Pulse Rate 11/28/21 0925 84     Resp 11/28/21 0925 20     Temp 11/28/21 0925 97.8 F (36.6 C)     Temp Source 11/28/21 0925 Oral     SpO2 11/28/21 0925 95 %     Weight 11/28/21 0926 238 lb (108 kg)     Height 11/28/21 0926 5\' 8"  (1.727 m)     Head Circumference --      Peak Flow --      Pain Score 11/28/21 0925 2     Pain Loc --      Pain Edu? --      Excl. in [place]? --     Most recent vital signs: Vitals:   11/28/21 1130 11/28/21 1200  BP: 121/85 (!) 119/94  Pulse: 78 83  Resp:  (!) 9  Temp:    SpO2: 93% 95%    General: Awake, no distress.  CV:  Good peripheral perfusion.  Resp:  Normal effort.  Abd:  No distention.  Other:  Cranial nerves II through XII grossly intact.  No pronator drift.  No finger dysmetria.  Symmetric 5/5 strength of all extremities.  Sensation intact to light touch in all extremities.  Unremarkable unassisted gait.  Sensation  is intact to light touch throughout the right upper extremity including the distribution of the radial ulnar and median nerves.  Patient has full strength and range of motion throughout this extremity.  Neck is unremarkable.    ED Results / Procedures / Treatments  Labs (all labs ordered are listed, but only abnormal results are displayed) Labs Reviewed  COMPREHENSIVE METABOLIC PANEL - Abnormal; Notable for the following components:      Result Value   Glucose, Bld 103 (*)    AST 46 (*)    ALT 62 (*)    All other components within normal limits  CBC WITH DIFFERENTIAL/PLATELET  TROPONIN I (HIGH SENSITIVITY)  TROPONIN I (HIGH SENSITIVITY)     EKG  EKG is markable sinus rhythm with a ventricular rate of 84, incomplete right bundle branch block, left axis deviation, otherwise unremarkable intervals with nonspecific ST change in lead III and V3 versus some artifact without other clear evidence of acute ischemia significant arrhythmia.   RADIOLOGY   Chest reviewed by myself shows no focal consoidation, effusion, edema, pneumothorax or other clear acute thoracic process. I also reviewed radiology  interpretation and agree with findings described.  MRI brain as well as MRA head and neck reviewed by myself show no evidence of large dissection, hemorrhage, significant acute ischemia or other clear acute process.  I reviewed radiology interpretation and agree with her findings of no evidence of an acute CVA or large vessel occlusion stenosis or any other clear acute intracranial process.  PROCEDURES:  Critical Care performed: No  .1-3 Lead EKG Interpretation Performed by: Gilles Chiquito, MD Authorized by: Gilles Chiquito, MD     Interpretation: normal     ECG rate assessment: normal     Rhythm: sinus rhythm     Ectopy: none     Conduction: normal    The patient is on the cardiac monitor to evaluate for evidence of arrhythmia and/or significant heart rate changes.   MEDICATIONS  ORDERED IN ED: Medications  aspirin chewable tablet 324 mg (324 mg Oral Given 11/28/21 1020)     IMPRESSION / MDM / ASSESSMENT AND PLAN / ED COURSE  I reviewed the triage vital signs and the nursing notes.                              Differential diagnosis includes, but is not limited to ACS, arrhythmia, anemia, metabolic derangements, cervical radiculopathy, atypical migraine, early bronchitis, with lower suspicion at this time for CVA or TIA given absence of any focal neurological deficits and associated chest tightness the patient described as primarily tingling in the right arm and neck think at this point is more likely referred symptoms.  EKG is markable sinus rhythm with a ventricular rate of 84, incomplete right bundle branch block, left axis deviation, otherwise unremarkable intervals with nonspecific ST change in lead III and V3 versus some artifact without other clear evidence of acute ischemia significant arrhythmia.  Troponins nonelevated x2 are not suggestive of ACS or myocarditis.  Chest reviewed by myself shows no focal consoidation, effusion, edema, pneumothorax or other clear acute thoracic process. I also reviewed radiology interpretation and agree with findings described.  MRI brain as well as MRA head and neck reviewed by myself show no evidence of large dissection, hemorrhage, significant acute ischemia or other clear acute process.  I reviewed radiology interpretation and agree with her findings of no evidence of an acute CVA or large vessel occlusion stenosis or any other clear acute intracranial process.  CMP shows no significant electrolyte or metabolic derangements.  CBC shows no leukocytosis or acute anemia.  Overall unclear urology for patient's numbness describes radiating right arm and right neck to the back of the head associate with some chest tightness heaviness and discomfort in the right arm.  I considered additional diagnoses database and observation but  given stable vitals with reassuring neurological exam and work-up with low suspicion for immediately life-threatening process I think patient stable for discharge with continued outpatient evaluation.  Discharged in stable condition.  Strict return precautions advised and discussed.      FINAL CLINICAL IMPRESSION(S) / ED DIAGNOSES   Final diagnoses:  Chest tightness  Paresthesia     Rx / DC Orders   ED Discharge Orders     None        Note:  This document was prepared using Dragon voice recognition software and may include unintentional dictation errors.   Gilles Chiquito, MD 11/28/21 [phone]

## 2021-11-28 NOTE — ED Triage Notes (Signed)
Pt reports was riding home this am around 0640 and he started with numbness to his right arm, back of neck and tip of ears. Pt reports also has some chest discomfort described as tight worsening with deep breathing.

## 2021-11-28 NOTE — ED Triage Notes (Signed)
Pt reports was started on Rosuvastatin  20mg  6-7 days ago

## 2022-10-17 ENCOUNTER — Emergency Department: Payer: Commercial Managed Care - PPO

## 2022-10-17 ENCOUNTER — Emergency Department
Admission: EM | Admit: 2022-10-17 | Discharge: 2022-10-17 | Disposition: A | Discharge status: Home / Self Care | Payer: Commercial Managed Care - PPO | Attending: Emergency Medicine | Admitting: Emergency Medicine

## 2022-10-17 ENCOUNTER — Other Ambulatory Visit: Payer: Self-pay

## 2022-10-17 DIAGNOSIS — R202 Paresthesia of skin: Secondary | ICD-10-CM | POA: Diagnosis not present

## 2022-10-17 DIAGNOSIS — R0789 Other chest pain: Secondary | ICD-10-CM | POA: Insufficient documentation

## 2022-10-17 DIAGNOSIS — R079 Chest pain, unspecified: Secondary | ICD-10-CM

## 2022-10-17 LAB — BASIC METABOLIC PANEL
Anion gap: 11 (ref 5–15)
BUN: 15 mg/dL (ref 6–20)
CO2: 24 mmol/L (ref 22–32)
Calcium: 9 mg/dL (ref 8.9–10.3)
Chloride: 104 mmol/L (ref 98–111)
Creatinine, Ser: 1.02 mg/dL (ref 0.61–1.24)
GFR, Estimated: >60 mL/min (ref >60)
Glucose, Bld: 95 mg/dL (ref 70–99)
Potassium: 4.7 mmol/L (ref 3.5–5.1)
Sodium: 139 mmol/L (ref 135–145)

## 2022-10-17 LAB — TROPONIN I (HIGH SENSITIVITY)
Troponin I (High Sensitivity): 3 ng/L (ref <18)
Troponin I (High Sensitivity): 2 ng/L (ref <18)

## 2022-10-17 LAB — CBC
HCT: 42.4 % (ref 39.0–52.0)
Hemoglobin: 14.5 g/dL (ref 13.0–17.0)
MCH: 30.7 pg (ref 26.0–34.0)
MCHC: 34.2 g/dL (ref 30.0–36.0)
MCV: 89.8 fL (ref 80.0–100.0)
Platelets: 219 10*3/uL (ref 150–400)
RBC: 4.72 MIL/uL (ref 4.22–5.81)
RDW: 12.3 % (ref 11.5–15.5)
WBC: 6.6 10*3/uL (ref 4.0–10.5)
nRBC: 0 % (ref 0.0–0.2)

## 2022-10-17 MED ORDER — ALUM & MAG HYDROXIDE-SIMETH 200-200-20 MG/5ML PO SUSP
30.0000 mL | Freq: Once | ORAL | Status: AC
  Administered 2022-10-17: 30 mL via ORAL
  Filled 2022-10-17: qty 30

## 2022-10-17 MED ORDER — LIDOCAINE VISCOUS HCL 2 % MT SOLN
15.0000 mL | Freq: Once | OROMUCOSAL | Status: AC
  Administered 2022-10-17: 15 mL via ORAL
  Filled 2022-10-17: qty 15

## 2022-10-17 NOTE — ED Notes (Signed)
Patient transported to CT 

## 2022-10-17 NOTE — ED Provider Notes (Signed)
[facility] Provider Note    Event Date/Time   First MD Initiated Contact with Patient 10/17/22 [phone]     (approximate)   History   Chest Pain   HPI  Alec Arnold is a 42 y.o. male  who presents to the emergency department today because of concern for chest pain. Pain started last night around 130 in the morning. The patient was awake at the time. He typically works third shift so that's not unusual for him. Pain is located in the center part of his chest. Intermittent. Pressure like. He also noticed some tingling in his left hand. At the time of my exam his symptoms had improved. The patient denies any shortness of breath. No unusual exertion.       Physical Exam   Triage Vital Signs: ED Triage Vitals  Enc Vitals Group     BP 10/17/22 0652 (!) 145/94     Pulse Rate 10/17/22 0652 76     Resp 10/17/22 0652 18     Temp 10/17/22 0652 98 F (36.7 C)     Temp Source 10/17/22 0652 Oral     SpO2 10/17/22 0652 96 %     Weight 10/17/22 0650 220 lb (99.8 kg)     Height 10/17/22 0650 5\' 8"  (1.727 m)     Head Circumference --      Peak Flow --      Pain Score 10/17/22 0650 1     Pain Loc --      Pain Edu? --      Excl. in [place]? --     Most recent vital signs: Vitals:   10/17/22 0652  BP: (!) 145/94  Pulse: 76  Resp: 18  Temp: 98 F (36.7 C)  SpO2: 96%   General: Awake, alert, oriented. CV:  Good peripheral perfusion. Regular rate and rhythm. Resp:  Normal effort. Lungs clear. Abd:  No distention.    ED Results / Procedures / Treatments   Labs (all labs ordered are listed, but only abnormal results are displayed) Labs Reviewed  BASIC METABOLIC PANEL  CBC  TROPONIN I (HIGH SENSITIVITY)  TROPONIN I (HIGH SENSITIVITY)     EKG  I, Nance Pear, attending physician, personally viewed and interpreted this EKG  EKG Time: 0651 Rate: 89 Rhythm: normal sinus rhythm Axis: left axis deviation Intervals: qtc 428 QRS: incomplete RBBB,  LAFB ST changes: no st elevation Impression: abnormal ekg    RADIOLOGY I independently interpreted and visualized the CXR. My interpretation: No pneumonia Radiology interpretation:  IMPRESSION:  No radiographic evidence of acute cardiopulmonary disease.    I independently interpreted and visualized the CT head. My interpretation: No bleed Radiology interpretation:  IMPRESSION:  1. No acute intracranial abnormalities. The appearance of the brain  is normal.      PROCEDURES:  Critical Care performed: No  Procedures   MEDICATIONS ORDERED IN ED: Medications - No data to display   IMPRESSION / MDM / [place] / ED COURSE  I reviewed the triage vital signs and the nursing notes.                              Differential diagnosis includes, but is not limited to, acs, pneumonia, pe, dissection, esophagitis, costochondritis.   Patient's presentation is most consistent with acute presentation with potential threat to life or bodily function.  Patient presents to the emergency department today because of concern for  chest pain. EKG without st elevation or dysrhythmia. Blood work with negative troponin times 2. CXR did not show sings of pneumonia or pneumothorax. At this time somewhat unclear etiology of the patient's chest pain but I do have low suspicion for significant cardiac or pulmonary etiology. Discussed with patient. At this time will plan on discharging, discussed importance of PCP follow up.     FINAL CLINICAL IMPRESSION(S) / ED DIAGNOSES   Final diagnoses:  Nonspecific chest pain     Note:  This document was prepared using Dragon voice recognition software and may include unintentional dictation errors.    Phineas Semen, MD 10/17/22 1225

## 2022-10-17 NOTE — Discharge Instructions (Signed)
Please seek medical attention for any high fevers, chest pain, shortness of breath, change in behavior, persistent vomiting, bloody stool or any other new or concerning symptoms.  

## 2022-10-17 NOTE — ED Triage Notes (Signed)
Pt reports chest pain 2 days ago with blurred vision in R eye and moving shapes. Reports symptoms resolved after a couple hours. Pt reports that tonight ~0200 chest pain with L arm tingling onset. Pt denies h/a, dizziness, vision change, at this time. Pt is alert and oriented following commands. Breathing unlabored speaking in full sentences. No neuro deficit noted on exam.

## 2023-11-22 ENCOUNTER — Other Ambulatory Visit: Payer: Self-pay | Admitting: Family Medicine

## 2023-11-22 DIAGNOSIS — R748 Abnormal levels of other serum enzymes: Secondary | ICD-10-CM

## 2023-11-22 DIAGNOSIS — E7800 Pure hypercholesterolemia, unspecified: Secondary | ICD-10-CM

## 2023-11-28 ENCOUNTER — Ambulatory Visit: Admission: RE | Admit: 2023-11-28 | Payer: Commercial Managed Care - PPO | Source: Ambulatory Visit

## 2023-11-29 ENCOUNTER — Ambulatory Visit
Admission: RE | Admit: 2023-11-29 | Discharge: 2023-11-29 | Disposition: A | Discharge status: Home / Self Care | Payer: Commercial Managed Care - PPO | Source: Ambulatory Visit | Attending: Family Medicine | Admitting: Family Medicine

## 2023-11-29 DIAGNOSIS — R748 Abnormal levels of other serum enzymes: Secondary | ICD-10-CM | POA: Insufficient documentation

## 2023-11-29 DIAGNOSIS — E7800 Pure hypercholesterolemia, unspecified: Secondary | ICD-10-CM | POA: Diagnosis present

## 2024-04-17 ENCOUNTER — Ambulatory Visit

## 2024-04-17 DIAGNOSIS — K644 Residual hemorrhoidal skin tags: Secondary | ICD-10-CM | POA: Diagnosis not present

## 2024-04-17 DIAGNOSIS — Z1211 Encounter for screening for malignant neoplasm of colon: Secondary | ICD-10-CM | POA: Diagnosis present

## 2024-04-17 DIAGNOSIS — K5289 Other specified noninfective gastroenteritis and colitis: Secondary | ICD-10-CM | POA: Diagnosis not present
